# Patient Record
Sex: Female | Born: 1980 | Race: Black or African American | Hispanic: No | Marital: Single | Smoking: Former smoker
Health system: Southern US, Community
[De-identification: ages and names within clinical notes are randomized; demographics above are authoritative.]

## PROBLEM LIST (undated history)

## (undated) DIAGNOSIS — O1490 Unspecified pre-eclampsia, unspecified trimester: Secondary | ICD-10-CM

## (undated) DIAGNOSIS — Z8759 Personal history of other complications of pregnancy, childbirth and the puerperium: Secondary | ICD-10-CM

## (undated) DIAGNOSIS — O139 Gestational [pregnancy-induced] hypertension without significant proteinuria, unspecified trimester: Secondary | ICD-10-CM

## (undated) DIAGNOSIS — J45909 Unspecified asthma, uncomplicated: Secondary | ICD-10-CM

## (undated) DIAGNOSIS — E669 Obesity, unspecified: Secondary | ICD-10-CM

## (undated) DIAGNOSIS — I10 Essential (primary) hypertension: Secondary | ICD-10-CM

## (undated) DIAGNOSIS — J40 Bronchitis, not specified as acute or chronic: Secondary | ICD-10-CM

## (undated) DIAGNOSIS — N611 Abscess of the breast and nipple: Secondary | ICD-10-CM

## (undated) HISTORY — DX: Gestational (pregnancy-induced) hypertension without significant proteinuria, unspecified trimester: O13.9

## (undated) HISTORY — PX: LAPAROSCOPY FOR ECTOPIC PREGNANCY: SUR765

## (undated) HISTORY — DX: Unspecified asthma, uncomplicated: J45.909

## (undated) HISTORY — DX: Unspecified pre-eclampsia, unspecified trimester: O14.90

## (undated) HISTORY — DX: Abscess of the breast and nipple: N61.1

## (undated) HISTORY — DX: Personal history of other complications of pregnancy, childbirth and the puerperium: Z87.59

---
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
## 2001-02-20 ENCOUNTER — Emergency Department (HOSPITAL_COMMUNITY): Admission: EM | Admit: 2001-02-20 | Discharge: 2001-02-20 | Payer: Self-pay | Admitting: Emergency Medicine

## 2001-02-20 ENCOUNTER — Encounter: Payer: Self-pay | Admitting: Emergency Medicine

## 2001-10-01 ENCOUNTER — Emergency Department (HOSPITAL_COMMUNITY): Admission: EM | Admit: 2001-10-01 | Discharge: 2001-10-01 | Payer: Self-pay | Admitting: Emergency Medicine

## 2001-10-14 ENCOUNTER — Emergency Department (HOSPITAL_COMMUNITY): Admission: EM | Admit: 2001-10-14 | Discharge: 2001-10-14 | Payer: Self-pay | Admitting: Emergency Medicine

## 2003-05-07 ENCOUNTER — Emergency Department (HOSPITAL_COMMUNITY): Admission: EM | Admit: 2003-05-07 | Discharge: 2003-05-07 | Payer: Self-pay | Admitting: Emergency Medicine

## 2003-06-26 ENCOUNTER — Other Ambulatory Visit: Admission: RE | Admit: 2003-06-26 | Discharge: 2003-06-26 | Payer: Self-pay | Admitting: *Deleted

## 2003-06-26 ENCOUNTER — Other Ambulatory Visit: Admission: RE | Admit: 2003-06-26 | Discharge: 2003-06-26 | Payer: Self-pay | Admitting: Obstetrics and Gynecology

## 2003-07-10 ENCOUNTER — Ambulatory Visit (HOSPITAL_COMMUNITY): Admission: RE | Admit: 2003-07-10 | Discharge: 2003-07-10 | Payer: Self-pay | Admitting: Obstetrics and Gynecology

## 2003-08-07 ENCOUNTER — Ambulatory Visit (HOSPITAL_COMMUNITY): Admission: RE | Admit: 2003-08-07 | Discharge: 2003-08-07 | Payer: Self-pay | Admitting: Obstetrics and Gynecology

## 2003-10-09 ENCOUNTER — Ambulatory Visit (HOSPITAL_COMMUNITY): Admission: RE | Admit: 2003-10-09 | Discharge: 2003-10-09 | Payer: Self-pay | Admitting: Obstetrics and Gynecology

## 2003-10-09 ENCOUNTER — Inpatient Hospital Stay (HOSPITAL_COMMUNITY): Admission: AD | Admit: 2003-10-09 | Discharge: 2003-10-09 | Payer: Self-pay | Admitting: Obstetrics and Gynecology

## 2003-10-10 ENCOUNTER — Inpatient Hospital Stay (HOSPITAL_COMMUNITY): Admission: AD | Admit: 2003-10-10 | Discharge: 2003-10-10 | Payer: Self-pay | Admitting: Obstetrics and Gynecology

## 2003-10-11 ENCOUNTER — Inpatient Hospital Stay (HOSPITAL_COMMUNITY): Admission: AD | Admit: 2003-10-11 | Discharge: 2003-10-11 | Payer: Self-pay | Admitting: Obstetrics and Gynecology

## 2003-10-22 ENCOUNTER — Inpatient Hospital Stay (HOSPITAL_COMMUNITY): Admission: AD | Admit: 2003-10-22 | Discharge: 2003-10-22 | Payer: Self-pay | Admitting: Obstetrics and Gynecology

## 2003-10-22 ENCOUNTER — Ambulatory Visit (HOSPITAL_COMMUNITY): Admission: RE | Admit: 2003-10-22 | Discharge: 2003-10-22 | Payer: Self-pay | Admitting: Obstetrics and Gynecology

## 2003-10-29 ENCOUNTER — Ambulatory Visit (HOSPITAL_COMMUNITY): Admission: RE | Admit: 2003-10-29 | Discharge: 2003-10-29 | Payer: Self-pay | Admitting: Obstetrics and Gynecology

## 2003-12-17 ENCOUNTER — Inpatient Hospital Stay (HOSPITAL_COMMUNITY): Admission: AD | Admit: 2003-12-17 | Discharge: 2003-12-22 | Payer: Self-pay | Admitting: Obstetrics and Gynecology

## 2003-12-19 ENCOUNTER — Encounter (INDEPENDENT_AMBULATORY_CARE_PROVIDER_SITE_OTHER): Payer: Self-pay | Admitting: Specialist

## 2003-12-19 DIAGNOSIS — B951 Streptococcus, group B, as the cause of diseases classified elsewhere: Secondary | ICD-10-CM

## 2003-12-19 DIAGNOSIS — O1490 Unspecified pre-eclampsia, unspecified trimester: Secondary | ICD-10-CM

## 2003-12-23 ENCOUNTER — Inpatient Hospital Stay (HOSPITAL_COMMUNITY): Admission: AD | Admit: 2003-12-23 | Discharge: 2003-12-23 | Payer: Self-pay | Admitting: Obstetrics and Gynecology

## 2005-02-06 ENCOUNTER — Emergency Department (HOSPITAL_COMMUNITY): Admission: EM | Admit: 2005-02-06 | Discharge: 2005-02-06 | Payer: Self-pay | Admitting: Emergency Medicine

## 2005-05-18 ENCOUNTER — Encounter (INDEPENDENT_AMBULATORY_CARE_PROVIDER_SITE_OTHER): Payer: Self-pay | Admitting: *Deleted

## 2005-05-18 ENCOUNTER — Ambulatory Visit: Payer: Self-pay | Admitting: Obstetrics and Gynecology

## 2006-03-10 ENCOUNTER — Inpatient Hospital Stay (HOSPITAL_COMMUNITY): Admission: AD | Admit: 2006-03-10 | Discharge: 2006-03-10 | Payer: Self-pay | Admitting: Obstetrics and Gynecology

## 2006-06-30 ENCOUNTER — Emergency Department (HOSPITAL_COMMUNITY): Admission: EM | Admit: 2006-06-30 | Discharge: 2006-06-30 | Payer: Self-pay | Admitting: *Deleted

## 2006-11-09 ENCOUNTER — Encounter: Payer: Self-pay | Admitting: Emergency Medicine

## 2006-11-10 ENCOUNTER — Encounter: Payer: Self-pay | Admitting: Obstetrics & Gynecology

## 2006-11-10 ENCOUNTER — Ambulatory Visit (HOSPITAL_COMMUNITY): Admission: AD | Admit: 2006-11-10 | Discharge: 2006-11-10 | Payer: Self-pay | Admitting: Obstetrics & Gynecology

## 2008-07-11 ENCOUNTER — Emergency Department (HOSPITAL_COMMUNITY): Admission: EM | Admit: 2008-07-11 | Discharge: 2008-07-12 | Payer: Self-pay | Admitting: Emergency Medicine

## 2009-03-24 ENCOUNTER — Emergency Department (HOSPITAL_COMMUNITY): Admission: EM | Admit: 2009-03-24 | Discharge: 2009-03-24 | Payer: Self-pay | Admitting: Emergency Medicine

## 2010-02-06 ENCOUNTER — Encounter: Payer: Self-pay | Admitting: Obstetrics and Gynecology

## 2010-04-11 LAB — URINE MICROSCOPIC-ADD ON

## 2010-04-11 LAB — URINALYSIS, ROUTINE W REFLEX MICROSCOPIC
Bilirubin Urine: NEGATIVE
Glucose, UA: NEGATIVE mg/dL
Ketones, ur: NEGATIVE mg/dL
Leukocytes, UA: NEGATIVE
Nitrite: NEGATIVE
Protein, ur: NEGATIVE mg/dL
Specific Gravity, Urine: 1.023 (ref 1.005–1.030)
Urobilinogen, UA: 1 mg/dL (ref 0.0–1.0)
pH: 7.5 (ref 5.0–8.0)

## 2010-04-11 LAB — POCT PREGNANCY, URINE: Preg Test, Ur: NEGATIVE

## 2010-06-01 NOTE — Op Note (Signed)
Veronica Adams, Veronica Adams             ACCOUNT NO.:  [id number]   MEDICAL RECORD NO.:  [id number]          PATIENT TYPE:  OIB   LOCATION:  9372                          [facility]   PHYSICIAN:  Roseanna Rainbow, M.D.DATE OF BIRTH:  06-Feb-1980   DATE OF PROCEDURE:  11/10/2006  DATE OF DISCHARGE:  11/10/2006                               OPERATIVE REPORT   PREOPERATIVE DIAGNOSIS:  Ruptured tubal ectopic pregnancy.   POSTOPERATIVE DIAGNOSIS:  Ruptured tubal ectopic pregnancy, left sided.   PROCEDURE:  Operative laparoscopy, left salpingectomy.   SURGEON:  Cyd Silence.   ANESTHESIA:  General endotracheal.   ESTIMATED BLOOD LOSS:  Minimal.   COMPLICATIONS:  None.   IV fluid and urine output as per anesthesiology.   FINDINGS:  Upon survey of the abdominal pelvic viscera, there were  omental adhesions related to her previous cesarean delivery in the  midline to the parietal peritoneum of the anterior abdominal wall.  There was a ruptured, left-sided, tubal knee pregnancy in the isthmic  portion of the tube. There was a fairly large hemoperitoneum.   PROCEDURES:  The patient was brought to the OR with an IV running and a  Foley catheter in place.  She was given general anesthesia and placed in  the dorsal lithotomy position.  She was then prepped and draped in the  usual sterile fashion.  After a time-out had been completed, a bivalve  speculum was placed in the patient's vagina.  The anterior lip of the  cervix was grasped with a single-tooth tenaculum.  A Hulka manipulator  was then advanced into the uterus and secured to the anterior lip of  cervix.  The single-tooth tenaculum and speculum were then removed.  A a  midline incision was made above the umbilicus and carried down sharply  to the fascia.  The fascia was incised.  The posterior rectus sheath was  elevated and entered sharply.  The parietal peritoneum was entered  sharply as well.  Sutures of 0 Vicryl  were then placed in the fascia.  The Hassan trocar and sleeve were then advanced into the abdomen.  The  abdomen was then insufflated with CO2 gas.  Bilateral 5-mm ports and a  suprapubic 10-mm port were then advanced into the abdomen under direct  visualization.  The fallopian tube distal to the ectopic was then  grasped, and the endosalpinx was then cauterized and transected using  the gyrus.  The tube was transected proximal to the ectopic pregnancy,  again using the gyrus and Endoshears.  Approximately 500 mL of blood was  noted upon entry into the abdomen.  Attempts were made to irrigate and  evacuate as much clot and blood as possible.  The specimen was then  placed into an EndoCatch and removed from the abdomen.  The mesosalpinx  was inspected and felt to be hemostatic.  All the instruments were then  removed from the abdomen.  The fascial incisions for the supraumbilical  incision and the suprapubic incisions were repaired with 0 Vicryl  sutures.  The skin was reapproximated with interrupted subcuticular  sutures of 3-0 Vicryl and  Dermabond.  The Hulka manipulator was  removed from the cervix and uterus with minimal bleeding noted from the  vagina.  At the close of the procedure, the instrument and pack counts  were said to be correct x2.  The patient was awakened from general  anesthesia and taken to the PACU in stable condition.      Roseanna Rainbow, M.D.  Electronically Signed     LAJ/MEDQ  D:  11/10/2006  T:  11/11/2006  Job:  [id number]

## 2010-06-04 NOTE — Group Therapy Note (Signed)
NAME:  Veronica Adams, Veronica Adams NO.:  [id number]   MEDICAL RECORD NO.:  [id number]          PATIENT TYPE:  WOC   LOCATION:  [facility]                   [facility]AC[facility]   PHYSICIAN:  Argentina Donovan, MD        DATE OF BIRTH:  1980/08/14   DATE OF SERVICE:                                    CLINIC NOTE   HISTORY OF PRESENT ILLNESS:  The patient is a 30 year old black female  gravida 1 para 0-1-0-0-1 by cesarean section with a child 80 months old who  was put on Labetalol postpartum after severe episodes of preeclampsia with  hypertension.  After four months she stopped that, but lately has continued  to have frontal headaches when she gets excited that she thinks may be  related to labile blood pressure.  On the day she is here, blood pressure  144/87, and she weighs 275 pounds.  She is here for Pap smear which was done  and contraception advise.  We have advised her against the pill or the patch  because of her weight and also because of the hypertension and suggested  either the Nuvaring, which we are going to give her two samples of, or the  Mirena, which she is going to consider.  Her only complaint is occasional  achiness in the lower abdomen near the scar of her surgery.   PHYSICAL EXAMINATION:  ABDOMEN:  Soft, flat, nontender.  No masses or  organomegaly.  Scar is well-healed with no sign of any thickening or keloid  formation.  External genitalia is normal.  BUN is within normal limits.  Vagina is clean and with rugae.  Cervix is clean and nulliparous.  Pap smear  is taken.  Uterus and adnexa, because the patient's habitus, could not be  examined.   IMPRESSION:  She is desiring birth control, prescription for Nuvaring,  hypertension, and we are going to start her on hydrochlorothiazide 25 mg.  If her headaches continue, I want her to go to the Medical Clinic at [facility].  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     ______________________________  Argentina Donovan, MD     PR/MEDQ  D:  05/18/2005  T:  05/19/2005  Job:  [id number]

## 2010-06-04 NOTE — H&P (Signed)
NAME:  Veronica Adams, Veronica Adams             ACCOUNT NO.:  [id number]   MEDICAL RECORD NO.:  [id number]          PATIENT TYPE:  MAT   LOCATION:  MATC                          [facility]   PHYSICIAN:  Naima A. Dillard, M.D. DATE OF BIRTH:  [birth year]   DATE OF ADMISSION:  12/17/2003  DATE OF DISCHARGE:                                HISTORY & PHYSICAL   HISTORY OF PRESENT ILLNESS:  Ms. Mcconaughy is a 30 year old single black  female primigravida at [redacted] weeks gestation who presents from the office  secondary to ultrasound showing estimated fetal weight in the 7th to 8th  percentile, and AFI of 9.1.  Her pregnancy has been followed by the [facility] certified nurse midwife service, and has been remarkable for  (1) late to care, (2) smoker, (3) obesity, (4) group B strep positive, (5)  shortened cervix at [redacted] weeks gestation.   PRENATAL LABORATORIES:  Her prenatal labs were collected on June 26, 2003.  Hemoglobin was 12.8, hematocrit 38.1, platelets 242,000, blood type B  positive, antibody negative, sickle cell trait negative, RPR nonreactive,  rubella immune, hepatitis B surface antigen negative, HIV nonreactive.  Pap  smear within normal limits.  Gonorrhea negative, Chlamydia negative.  Cystic  fibrosis negative.  Quad screen was negative.  Cultures of the vaginal tract  for group B strep on October 09, 2003 was positive.  One-hour Glucola from  August 12, 2003 was 79.   HISTORY OF PRESENT PREGNANCY:  The patient presented for care at [facility] on June 26, 2003 at [redacted] weeks gestation.  She was treated for  bacterial vaginosis at that time.  Pregnancy ultrasonography on July 10, 2003 gave an [facility] of December 27, 2003.  Anatomy scan was completed at [redacted]  weeks gestation.  At that time, the cervix was 2.4 cm.  Repeat  ultrasonography at [redacted] weeks gestation showed 2.5 cm.  Pregnancy  ultrasonography at [redacted] weeks gestation showed estimated fetal weight in the  25th to 50th  percentile with AFI of 15.9 cm.  The cervix at that time was  1.5 cm transvaginally.  Fetal fibronectin was collected, as well as  gonorrhea, Chlamydia, and group B strep.  To digital exam, the cervix was  closed and 70%.  The patient was give betamethasone.  The patient was also  put on bed rest.  The gonorrhea and Chlamydia were negative.  Group B strep  was positive, and fetal fibronectin were all negative.  The next  ultrasonography at [redacted] weeks gestation showed a dynamic cervix, and at [redacted]  weeks gestation showed cervix of 1.3 cm with normal fluid.  The patient  continued on bed rest.  The rest of her prenatal care was unremarkable.   OBSTETRICAL HISTORY:  She is a primigravida.   MEDICAL HISTORY:  She has no medication allergies.  She experience menarche  at the age of 22 with 28 day cycles lasting 4-5 days.  She reports having  had the usual childhood illnesses.  She has chronic bronchitis and uses an  albuterol inhaler for that.  She has been a smoker  in the past, and she quit  at [redacted] weeks gestation.   FAMILY MEDICAL HISTORY:  Remarkable for a sister with depression.  Maternal  grandmother with diabetes.   GENETIC HISTORY:  Father of the baby's side has a cousin with mental  retardation.   SOCIAL HISTORY:  Father of the baby is not involved.  The patient has a high  school education, and is employed as a Water engineer.  She denies any  alcohol or illicit drug use with the pregnancy.   OBJECTIVE DATA:  VITAL SIGNS:  Blood pressures are 140s/80s.  Other vital  signs are stable.  She is afebrile.  HEENT:  Grossly within normal limits.  CHEST:  Clear to auscultation.  HEART:  Regular rate and rhythm.  ABDOMEN:  Gravid in contour with fundal height extending approximately 40 cm  above the pubic symphysis.  Electronic fetal monitoring shows reactive and  reassuring fetal heart rate with rare uterine contractions.  Cervix is  closed, 60%, vertex -3.   Ultrasound shows fluid at  4.5 cm, and estimated fetal weight determined in  the office of 7-8 percentile.  PIH labs are within normal limits.   ASSESSMENT:  1.  Intrauterine pregnancy at 38 weeks.  2.  Intrauterine growth retardation.  3.  Oligohydramnios.  4.  Unfavorable cervix.   PLAN:  1.  The plan is to admit to the birthing suites per Dr. Normand Sloop.  2.  Routine M.D. orders.  3.  Risks and benefits of induction have been reviewed with the patient.      She consents to proceeding with Cervidil ripening overnight followed by      Pitocin in the morning.  She understands the increased risks of cesarean      section and fetal distress with induction.  4.  Antibiotics will be started with in active labor for group B strep      prophylaxis.     Kimb   KS/MEDQ  D:  12/17/2003  T:  12/17/2003  Job:  [id number]

## 2010-06-04 NOTE — Discharge Summary (Signed)
Veronica Adams, Veronica Adams             ACCOUNT NO.:  [id number]   MEDICAL RECORD NO.:  [id number]          PATIENT TYPE:  INP   LOCATION:  9140                          [facility]   PHYSICIAN:  Hal Morales, M.D.DATE OF BIRTH:  Jun 05, 1980   DATE OF ADMISSION:  12/17/2003  DATE OF DISCHARGE:                                 DISCHARGE SUMMARY   ADMISSION DIAGNOSES:  1.  Intrauterine pregnancy at term.  2.  Intrauterine growth restriction.  3.  Oligohydramnios.  4.  Reassuring antenatal testing.  5.  Unfavorable cervix.   DISCHARGE DIAGNOSES:  1.  Intrauterine pregnancy at term.  2.  Intrauterine growth restriction.  3.  Oligohydramnios.  4.  Reassuring antenatal testing.  5.  Unfavorable cervix.  6.  Nonreassuring fetal heart rate tracing.  7.  Status post primary low transverse cesarean section.  8.  Bottle feeding.  9.  Plans oral contraceptives.  10. Fluid retention postpartum   PROCEDURES THIS ADMISSION:  1.  Primary low transverse cesarean section for delivery of a viable female      infant who had Apgars of 8 and 9 and weighed 5 pounds 1 ounce on      December 19, 2003; attended in delivery by Dr. Osborn Coho and Wynelle Bourgeois, C.N.M.   HOSPITAL COURSE:  Veronica Adams is a 30 year old gravida 1 para 0 at 76 and  four-sevenths weeks induced secondary to IUGR and oligohydramnios.  She had  an ultrasound today that showed an estimated fetal weight in the seventh to  eight percentile or less than 10th percentile, and an AFI of 4.5.  She was  admitted for cervical ripening initially with Cervidil and was then started  on Pitocin on December 18, 2003.  Her water was broken around 5:30 p.m. on  December 18, 2003 and at that point she was 3 cm, 90%, and a 0 station.  By  8:20 p.m. she was 5-6 cm and around 9:30 p.m. she had a recurrent  deceleration with contractions every 2 minutes and the Pitocin was then  discontinued.  She was fully dilated at 1:30 a.m. and had an  urge to push  but was having variable decelerations and a baseline heart rate of the 100s  to 110s.  She was thus recommended to proceed with a cesarean section for  delivery due to the change in fetal heart rate baseline and the variable  decelerations with pushing.  She underwent the cesarean section for delivery  of  a viable female infant who weighed 5 pounds 1 ounce and had Apgars of 8  and 9.  She did have a tight nuchal cord x2 with a body cord noted at  delivery.  She was attended in delivery by Dr. Osborn Coho and Wynelle Bourgeois, C.N.M.  Please see operative note for further details.  Postoperatively, the patient has done well.  She is ambulating, voiding, and  eating without difficulty.  She is bottle feeding her baby also without  difficulty.  She is afebrile and her vital signs have been stable throughout  her postoperative  course.  It was noted on the day of discharge that her  weight had increased almost 10 pounds from her admission weight.  She also  was noted to have had several blood pressures with diastolic readings of 90-  95.  [name] was given Lasix 40 mg po, and was then deemed ready for  discharge.   DISCHARGE INSTRUCTIONS:  As per the [facility] OB[facility] handout.   DISCHARGE MEDICATIONS:  1.  Motrin 600 mg p.o. q.6h. p.r.n. for pain.  2.  Tylox one to two p.o. q.4-6h. p.r.n. for pain.  3.  Prenatal vitamins daily.  4.  Lasix 40 mg bid  5.  KDur 10 meq daily   DISCHARGE LABORATORY DATA:  Her hemoglobin is 11.1; wbc count is 8.2; and  platelets are 170,000.   DISCHARGE FOLLOW-UP:  Home visit by [facility] Nurse in 48 hours for blood  pressure check  Visit at [facility] in 1-2 weeks for blood pressure and weight  check  In 6 weeks at [facility] or p.r.n.   DISCHARGE STATUS:  Well and stable.     Shel   SJD/MEDQ  D:  12/22/2003  T:  12/22/2003  Job:  [id number]

## 2010-06-04 NOTE — Op Note (Signed)
Veronica Adams, Veronica Adams             ACCOUNT NO.:  [id number]   MEDICAL RECORD NO.:  [id number]          PATIENT TYPE:  INP   LOCATION:  9176                          [facility]   PHYSICIAN:  Osborn Coho, M.D.   DATE OF BIRTH:  02/01/1980   DATE OF PROCEDURE:  12/19/2003  DATE OF DISCHARGE:                                 OPERATIVE REPORT   PREOPERATIVE DIAGNOSES:  1.  Term intrauterine pregnancy.  2.  Intrauterine growth retardation.  3.  Oligohydramnios.  4.  Induction.  5.  Nonreassuring fetal heart tracing.   POSTOPERATIVE DIAGNOSES:  1.  Term intrauterine pregnancy.  2.  Intrauterine growth retardation.  3.  Oligohydramnios.  4.  Induction.  5.  Nonreassuring fetal heart tracing.   PROCEDURE:  Primary low transverse cesarean section.   ANESTHESIA:  Epidural.   SURGEON:  Osborn Coho, M.D.   ASSISTANT:  Elby Showers. Williams, C.N.M.   FLUIDS:  1400 cc.   ESTIMATED BLOOD LOSS:  1000 cc.   URINE OUTPUT:  175 cc.   COMPLICATIONS:  None.   FINDINGS:  Live female infant with Apgars of 8 at one minute and 9 at five  minutes.  Nuchal cord x2 that was tight along with cord wrapped around body.  Weight 5 pounds 1 ounce.  Normal-appearing bilateral ovaries and fallopian  tubes.  Cord pH venous 7.22.  Unable to obtain arterial cord pH, but it was  attempted.   DESCRIPTION OF PROCEDURE:  The patient was taken to the operating room after  the risks, benefits, and alternatives were discussed with the patient.  The  patient verbalized understanding and consent verified.  The patient was  given a surgical level via the epidural and prepped and draped in the normal  sterile fashion.   A Pfannenstiel skin incision was made and carried down to the underlying  layer of fascia with the Bovie.  The fascia was excised bilaterally in the  midline with the Bovie and this fascial incision extended bilaterally with  the Mayo scissors.  Kocher clamps were placed on the inferior  aspect of the  fascial incision, and the rectus muscles were excised from the fascia.  The  same was done on the superior aspect of the fascial incision.  The muscles  were separated in the midline, and the peritoneum was entered bluntly.  The  bladder blade was placed, and the bladder flap was created with the  Metzenbaum scissors.  The uterine incision was made with the scalpel and  extended bilaterally with the bandage scissors.  The infant was delivered  and tight nuchal cord x2 with body cord, as stated in the findings.  The  cord was clamped and cut and the infant handed to the awaiting  pediatricians.  Cord blood was collected.  Cefoxitin was administered.  The  placenta was removed via fundal massage and sent to pathology.  The uterus  was cleared of all clots and debris.  The uterine incision was repaired with  0 Vicryl in a running locked fashion, and a second imbricating layer was  performed.  The intra-abdominal cavity was  irrigated, and the uterine  incision was noted to be hemostatic.  A 2-0 chromic was used to close the  peritoneum in a running fashion.  The fascia was repaired with 0 Vicryl in a  running fashion.  The subcutaneous tissue was irrigated and made hemostatic  with the Bovie.  A 2-0 plain was used to reapproximate the subcutaneous  tissue.  The skin was closed with 3-0 Monocryl using a subcuticular stitch.  Sponge, needle, and instrument counts were correct.   The patient tolerated the procedure well and was returned to the recovery  room in good condition.     Ange   AR/MEDQ  D:  12/19/2003  T:  12/19/2003  Job:  [id number]

## 2010-06-04 NOTE — Discharge Summary (Signed)
NAME:  Veronica Adams, Veronica Adams             ACCOUNT NO.:  [id number]   MEDICAL RECORD NO.:  [id number]          PATIENT TYPE:  INP   LOCATION:  9140                          [facility]   PHYSICIAN:  Naima A. Dillard, M.D. DATE OF BIRTH:  05-17-1980   DATE OF ADMISSION:  12/17/2003  DATE OF DISCHARGE:  12/22/2003                                 DISCHARGE SUMMARY   ADDENDUM TO DICTATION #161096:  The patient in her postpartum course was  noted to have some blood pressure elevations, diastolic's of 91 and 97 on  her postpartum vital signs and had an 8 pound weight gain since admission so  was started on Lasix 40 mg b.i.d. and Kay Ciel 20 mEq q.d. and is to have a  __________ nurse blood pressure check on December 24, 2003 with a followup at  [facility] in two weeks for a blood pressure check and weight  check. The patient is also to call for any signs or symptoms of shortness of  breath, chest pain, etc.     Shel   SJD/MEDQ  D:  12/22/2003  T:  12/23/2003  Job:  [id number]

## 2010-08-11 ENCOUNTER — Inpatient Hospital Stay (INDEPENDENT_AMBULATORY_CARE_PROVIDER_SITE_OTHER)
Admission: RE | Admit: 2010-08-11 | Discharge: 2010-08-11 | Disposition: A | Discharge status: Home / Self Care | Payer: Self-pay | Source: Ambulatory Visit | Attending: Emergency Medicine | Admitting: Emergency Medicine

## 2010-08-11 DIAGNOSIS — B86 Scabies: Secondary | ICD-10-CM

## 2010-10-27 LAB — COMPREHENSIVE METABOLIC PANEL
ALT: 12
AST: 16
Albumin: 3.2 — ABNORMAL LOW
Alkaline Phosphatase: 41
BUN: 12
Chloride: 105
Potassium: 3.5
Sodium: 136
Total Bilirubin: 0.4

## 2010-10-27 LAB — HCG, QUANTITATIVE, PREGNANCY: hCG, Beta Chain, Quant, S: 1892 — ABNORMAL HIGH

## 2010-10-27 LAB — BASIC METABOLIC PANEL
BUN: 7
CO2: 26
Calcium: 7.7 — ABNORMAL LOW
Chloride: 108
Creatinine, Ser: 0.84
GFR calc Af Amer: >60
GFR calc non Af Amer: >60
Glucose, Bld: 117 — ABNORMAL HIGH
Potassium: 3.7
Sodium: 137

## 2010-10-27 LAB — DIFFERENTIAL
Basophils Absolute: 0
Basophils Absolute: 0.1
Basophils Relative: 0
Basophils Relative: 1
Eosinophils Absolute: 0.1
Eosinophils Absolute: 0.3
Eosinophils Relative: 3
Monocytes Absolute: 0.3
Monocytes Relative: 4
Monocytes Relative: 5
Neutro Abs: 3.8
Neutro Abs: 8.5 — ABNORMAL HIGH
Neutrophils Relative %: 83 — ABNORMAL HIGH

## 2010-10-27 LAB — URINE MICROSCOPIC-ADD ON

## 2010-10-27 LAB — TYPE AND SCREEN
ABO/RH(D): B POS
ABO/RH(D): B POS
Antibody Screen: NEGATIVE
Antibody Screen: NEGATIVE

## 2010-10-27 LAB — PROTIME-INR
INR: 1
Prothrombin Time: 13.7

## 2010-10-27 LAB — CBC
HCT: 27.5 — ABNORMAL LOW
HCT: 33.2 — ABNORMAL LOW
HCT: 35 — ABNORMAL LOW
Hemoglobin: 11.4 — ABNORMAL LOW
Hemoglobin: 9.5 — ABNORMAL LOW
MCHC: 34.3
MCHC: 34.7
MCHC: 33
MCV: 91.7
MCV: 91.8
MCV: 92.4
Platelets: 172
Platelets: 197
Platelets: 183
Platelets: 83 — ABNORMAL LOW
RBC: 3 — ABNORMAL LOW
RBC: 3.61 — ABNORMAL LOW
RBC: 3.73 — ABNORMAL LOW
RDW: 13.6
RDW: 13.7
WBC: 10.1
WBC: 7.5
WBC: 10.2
WBC: 9

## 2010-10-27 LAB — URINALYSIS, ROUTINE W REFLEX MICROSCOPIC
Bilirubin Urine: NEGATIVE
Glucose, UA: NEGATIVE
Hgb urine dipstick: NEGATIVE
Ketones, ur: 15 — AB
Protein, ur: 30 — AB
Urobilinogen, UA: 1

## 2010-10-27 LAB — WET PREP, GENITAL
Trich, Wet Prep: NONE SEEN
Yeast Wet Prep HPF POC: NONE SEEN

## 2010-10-27 LAB — APTT: aPTT: 26

## 2012-05-20 ENCOUNTER — Emergency Department (HOSPITAL_COMMUNITY)
Admission: EM | Admit: 2012-05-20 | Discharge: 2012-05-20 | Disposition: A | Discharge status: Home / Self Care | Payer: Medicaid Other | Attending: Emergency Medicine | Admitting: Emergency Medicine

## 2012-05-20 ENCOUNTER — Encounter (HOSPITAL_COMMUNITY): Payer: Self-pay | Admitting: *Deleted

## 2012-05-20 DIAGNOSIS — L509 Urticaria, unspecified: Secondary | ICD-10-CM | POA: Insufficient documentation

## 2012-05-20 DIAGNOSIS — L254 Unspecified contact dermatitis due to food in contact with skin: Secondary | ICD-10-CM | POA: Insufficient documentation

## 2012-05-20 DIAGNOSIS — L299 Pruritus, unspecified: Secondary | ICD-10-CM | POA: Insufficient documentation

## 2012-05-20 MED ORDER — PREDNISONE 20 MG PO TABS
60.0000 mg | ORAL_TABLET | Freq: Once | ORAL | Status: AC
  Administered 2012-05-20: 60 mg via ORAL
  Filled 2012-05-20: qty 3

## 2012-05-20 MED ORDER — DIPHENHYDRAMINE HCL 25 MG PO CAPS
50.0000 mg | ORAL_CAPSULE | Freq: Once | ORAL | Status: AC
  Administered 2012-05-20: 50 mg via ORAL
  Filled 2012-05-20: qty 2

## 2012-05-20 MED ORDER — FAMOTIDINE 20 MG PO TABS
20.0000 mg | ORAL_TABLET | Freq: Once | ORAL | Status: AC
  Administered 2012-05-20: 20 mg via ORAL
  Filled 2012-05-20: qty 1

## 2012-05-20 MED ORDER — DIPHENHYDRAMINE HCL 50 MG PO CAPS: 25.0000 mg | ORAL_CAPSULE | ORAL | Status: DC | PRN

## 2012-05-20 MED ORDER — PREDNISONE 20 MG PO TABS: 60.0000 mg | ORAL_TABLET | Freq: Once | ORAL | Status: AC

## 2012-05-20 MED ORDER — FAMOTIDINE 20 MG PO TABS: 20.0000 mg | ORAL_TABLET | Freq: Two times a day (BID) | ORAL | Status: DC

## 2012-05-20 NOTE — ED Provider Notes (Signed)
History     CSN: [id number]  Arrival date & time 05/20/12  0958   First MD Initiated Contact with Patient 05/20/12 1003      No chief complaint on file.   (Consider location/radiation/quality/duration/timing/severity/associated sxs/prior treatment) Patient is a 32 y.o. female presenting with allergic reaction. The history is provided by the patient. No language interpreter was used.  Allergic Reaction The primary symptoms are  rash. The primary symptoms do not include wheezing, shortness of breath, nausea, vomiting, palpitations or angioedema. The current episode started 13 to 24 hours ago.  The onset of the reaction was associated with eating. Associated symptoms comments: Itching rash that started after going to a fish fry. Rash began on her face and spread to become generalized over the next few hours. No SOB, oral swelling. No history of anaphylaxis.Marland Kitchen    No past medical history on file.  No past surgical history on file.  No family history on file.  History  Substance Use Topics  . Smoking status: Not on file  . Smokeless tobacco: Not on file  . Alcohol Use: Not on file    OB History   No data available      Review of Systems  Constitutional: Negative for fever and chills.  HENT: Negative.  Negative for facial swelling.   Respiratory: Negative.  Negative for shortness of breath, wheezing and stridor.   Cardiovascular: Negative.  Negative for palpitations.  Gastrointestinal: Negative.  Negative for nausea and vomiting.  Musculoskeletal: Negative.   Skin: Positive for rash.  Neurological: Negative.  Negative for weakness.  Psychiatric/Behavioral: Negative for confusion.    Allergies  Review of patient's allergies indicates not on file.  Home Medications   Current Outpatient Rx  Name  Route  Sig  Dispense  Refill  . diphenhydrAMINE (BENADRYL) 25 MG tablet   Oral   Take 50 mg by mouth every 6 (six) hours as needed for itching or allergies.            There were no vitals taken for this visit.  Physical Exam  Constitutional: She is oriented to person, place, and time. She appears well-developed and well-nourished.  HENT:  Head: Normocephalic.  Mouth/Throat: Oropharynx is clear and moist.  Eyes: Conjunctivae are normal.  Neck: Normal range of motion. Neck supple.  Cardiovascular: Normal rate and regular rhythm.   No murmur heard. Pulmonary/Chest: Effort normal and breath sounds normal. She has no wheezes. She has no rales. She exhibits no tenderness.  Abdominal: Soft. Bowel sounds are normal. There is no tenderness. There is no rebound and no guarding.  Musculoskeletal: Normal range of motion.  Neurological: She is alert and oriented to person, place, and time.  Skin: Skin is warm and dry. No rash noted.  Welts c/w hives in generalized distribution.  Psychiatric: She has a normal mood and affect.    ED Course  Procedures (including critical care time)  Labs Reviewed - No data to display No results found.   No diagnosis found. 1. Hives    MDM  She feels better after medications in ED. Rash still present but decreasing. Patient feels stable and comfortable with discharge home.         Arnoldo Hooker, PA-C 05/20/12 1043

## 2012-05-20 NOTE — ED Provider Notes (Signed)
Medical screening examination/treatment/procedure(s) were performed by non-physician practitioner and as supervising physician I was immediately available for consultation/collaboration.   Charles B. Bernette Mayers, MD 05/20/12 1044

## 2012-05-20 NOTE — ED Notes (Signed)
To ED for eval of itching and rash after eating seafood last pm. States she has never had a reaction to seafood. No airway compromise. Skin w/d, resp e/u. Speaking in clear full sentences.

## 2013-07-18 ENCOUNTER — Encounter (HOSPITAL_COMMUNITY): Payer: Self-pay | Admitting: Emergency Medicine

## 2013-07-18 ENCOUNTER — Emergency Department (HOSPITAL_COMMUNITY)
Admission: EM | Admit: 2013-07-18 | Discharge: 2013-07-18 | Disposition: A | Discharge status: Home / Self Care | Payer: Medicaid Other | Attending: Emergency Medicine | Admitting: Emergency Medicine

## 2013-07-18 DIAGNOSIS — R197 Diarrhea, unspecified: Secondary | ICD-10-CM

## 2013-07-18 DIAGNOSIS — F17200 Nicotine dependence, unspecified, uncomplicated: Secondary | ICD-10-CM | POA: Insufficient documentation

## 2013-07-18 DIAGNOSIS — E669 Obesity, unspecified: Secondary | ICD-10-CM | POA: Insufficient documentation

## 2013-07-18 DIAGNOSIS — I10 Essential (primary) hypertension: Secondary | ICD-10-CM | POA: Insufficient documentation

## 2013-07-18 HISTORY — DX: Essential (primary) hypertension: I10

## 2013-07-18 HISTORY — DX: Bronchitis, not specified as acute or chronic: J40

## 2013-07-18 HISTORY — DX: Obesity, unspecified: E66.9

## 2013-07-18 LAB — CBC WITH DIFFERENTIAL/PLATELET
Basophils Absolute: 0 10*3/uL (ref 0.0–0.1)
Basophils Relative: 1 % (ref 0–1)
EOS ABS: 0.3 10*3/uL (ref 0.0–0.7)
EOS PCT: 3 % (ref 0–5)
HCT: 39.2 % (ref 36.0–46.0)
HEMOGLOBIN: 13.1 g/dL (ref 12.0–15.0)
LYMPHS ABS: 3.6 10*3/uL (ref 0.7–4.0)
LYMPHS PCT: 46 % (ref 12–46)
MCH: 30.3 pg (ref 26.0–34.0)
MCHC: 33.4 g/dL (ref 30.0–36.0)
MCV: 90.7 fL (ref 78.0–100.0)
MONOS PCT: 6 % (ref 3–12)
Monocytes Absolute: 0.5 10*3/uL (ref 0.1–1.0)
NEUTROS PCT: 44 % (ref 43–77)
Neutro Abs: 3.4 10*3/uL (ref 1.7–7.7)
PLATELETS: 236 10*3/uL (ref 150–400)
RBC: 4.32 MIL/uL (ref 3.87–5.11)
RDW: 13.2 % (ref 11.5–15.5)
WBC: 7.8 10*3/uL (ref 4.0–10.5)

## 2013-07-18 LAB — URINALYSIS, ROUTINE W REFLEX MICROSCOPIC
Bilirubin Urine: NEGATIVE
Glucose, UA: NEGATIVE mg/dL
HGB URINE DIPSTICK: NEGATIVE
KETONES UR: NEGATIVE mg/dL
Leukocytes, UA: NEGATIVE
NITRITE: NEGATIVE
Protein, ur: NEGATIVE mg/dL
Specific Gravity, Urine: 1.026 (ref 1.005–1.030)
UROBILINOGEN UA: 1 mg/dL (ref 0.0–1.0)
pH: 5.5 (ref 5.0–8.0)

## 2013-07-18 LAB — COMPREHENSIVE METABOLIC PANEL
ALT: 14 U/L (ref 0–35)
ANION GAP: 15 (ref 5–15)
AST: 18 U/L (ref 0–37)
Albumin: 3.8 g/dL (ref 3.5–5.2)
Alkaline Phosphatase: 49 U/L (ref 39–117)
BUN: 13 mg/dL (ref 6–23)
CO2: 23 meq/L (ref 19–32)
Calcium: 9.3 mg/dL (ref 8.4–10.5)
Chloride: 98 mEq/L (ref 96–112)
Creatinine, Ser: 1.06 mg/dL (ref 0.50–1.10)
GFR calc non Af Amer: 68 mL/min — ABNORMAL LOW (ref >90)
GFR, EST AFRICAN AMERICAN: 78 mL/min — AB (ref >90)
GLUCOSE: 94 mg/dL (ref 70–99)
POTASSIUM: 4.4 meq/L (ref 3.7–5.3)
Sodium: 136 mEq/L — ABNORMAL LOW (ref 137–147)
TOTAL PROTEIN: 7.6 g/dL (ref 6.0–8.3)
Total Bilirubin: 0.2 mg/dL — ABNORMAL LOW (ref 0.3–1.2)

## 2013-07-18 NOTE — ED Notes (Signed)
Pt. reports diarrhea onset 2 weeks ago " getting better" , intermittent abdominal cramping and malodorus urine .

## 2013-07-18 NOTE — ED Provider Notes (Signed)
CSN: [id number]     Arrival date & time 07/18/13  2017 History   First MD Initiated Contact with Patient 07/18/13 2301     Chief Complaint  Patient presents with  . Diarrhea     (Consider location/radiation/quality/duration/timing/severity/associated sxs/prior Treatment) HPI This is a 33 year old African American female patient who presents with complaints of frequent soft stools for the past 2 weeks. The patient says that every time she eats, she feels her stomach rumbling and then has a soft stool. She denies experiencing abdominal pain. She has not had any bloody stools or melena. No fever, nausea or vomiting.  The patient states that she feels like she may need to change her diet-specifically that she may need to try a lactose free diet. She has not her primary care physician. She is not taking any medications. She has no history of abdominal surgeries.  Past Medical History  Diagnosis Date  . Obesity   . Hypertension   . Bronchitis    Past Surgical History  Procedure Laterality Date  . Cesarean section     No family history on file. History  Substance Use Topics  . Smoking status: Current Every Day Smoker  . Smokeless tobacco: Not on file  . Alcohol Use: Yes   OB History   Grav Para Term Preterm Abortions TAB SAB Ect Mult Living                 Review of Systems Ten point review of symptoms performed and is negative with the exception of symptoms noted above.     Allergies  Review of patient's allergies indicates no known allergies.  Home Medications   Prior to Admission medications   Not on File   BP 126/83  Pulse 86  Temp(Src) 99.7 F (37.6 C) (Oral)  Resp 14  Ht 5\' 5"  (1.651 m)  Wt 287 lb (130.182 kg)  BMI 47.76 kg/m2  SpO2 99%  LMP 06/23/2013 Physical Exam Gen: well developed and well nourished appearing Head: NCAT Eyes: PERL, EOMI Nose: no epistaixis or rhinorrhea Mouth/throat: mucosa is moist and pink Neck: supple, no stridor Lungs: CTA B,  no wheezing, rhonchi or rales CV: RRR, no murmur, extremities appear well perfused.  Abd: soft, obese, notender, nondistended Back: no ttp, no cva ttp Skin: warm and dry Ext: normal to inspection, no dependent edema Neuro: CN ii-xii grossly intact, no focal deficits Psyche; normal affect,  calm and cooperative.   ED Course  Procedures (including critical care time) Labs Review   Results for orders placed during the hospital encounter of 07/18/13 (from the past 24 hour(s))  URINALYSIS, ROUTINE W REFLEX MICROSCOPIC     Status: None   Collection Time    07/18/13  8:06 PM      Result Value Ref Range   Color, Urine YELLOW  YELLOW   APPearance CLEAR  CLEAR   Specific Gravity, Urine 1.026  1.005 - 1.030   pH 5.5  5.0 - 8.0   Glucose, UA NEGATIVE  NEGATIVE mg/dL   Hgb urine dipstick NEGATIVE  NEGATIVE   Bilirubin Urine NEGATIVE  NEGATIVE   Ketones, ur NEGATIVE  NEGATIVE mg/dL   Protein, ur NEGATIVE  NEGATIVE mg/dL   Urobilinogen, UA 1.0  0.0 - 1.0 mg/dL   Nitrite NEGATIVE  NEGATIVE   Leukocytes, UA NEGATIVE  NEGATIVE  CBC WITH DIFFERENTIAL     Status: None   Collection Time    07/18/13  8:50 PM      Result Value  Ref Range   WBC 7.8  4.0 - 10.5 K/uL   RBC 4.32  3.87 - 5.11 MIL/uL   Hemoglobin 13.1  12.0 - 15.0 g/dL   HCT 40.939.2  81.136.0 - 91.446.0 %   MCV 90.7  78.0 - 100.0 fL   MCH 30.3  26.0 - 34.0 pg   MCHC 33.4  30.0 - 36.0 g/dL   RDW 78.213.2  95.611.5 - 21.315.5 %   Platelets 236  150 - 400 K/uL   Neutrophils Relative % 44  43 - 77 %   Neutro Abs 3.4  1.7 - 7.7 K/uL   Lymphocytes Relative 46  12 - 46 %   Lymphs Abs 3.6  0.7 - 4.0 K/uL   Monocytes Relative 6  3 - 12 %   Monocytes Absolute 0.5  0.1 - 1.0 K/uL   Eosinophils Relative 3  0 - 5 %   Eosinophils Absolute 0.3  0.0 - 0.7 K/uL   Basophils Relative 1  0 - 1 %   Basophils Absolute 0.0  0.0 - 0.1 K/uL  COMPREHENSIVE METABOLIC PANEL     Status: Abnormal   Collection Time    07/18/13  8:50 PM      Result Value Ref Range   Sodium  136 (*) 137 - 147 mEq/L   Potassium 4.4  3.7 - 5.3 mEq/L   Chloride 98  96 - 112 mEq/L   CO2 23  19 - 32 mEq/L   Glucose, Bld 94  70 - 99 mg/dL   BUN 13  6 - 23 mg/dL   Creatinine, Ser 0.861.06  0.50 - 1.10 mg/dL   Calcium 9.3  8.4 - 57.810.5 mg/dL   Total Protein 7.6  6.0 - 8.3 g/dL   Albumin 3.8  3.5 - 5.2 g/dL   AST 18  0 - 37 U/L   ALT 14  0 - 35 U/L   Alkaline Phosphatase 49  39 - 117 U/L   Total Bilirubin 0.2 (*) 0.3 - 1.2 mg/dL   GFR calc non Af Amer 68 (*) >90 mL/min   GFR calc Af Amer 78 (*) >90 mL/min   Anion gap 15  5 - 15     MDM   ED workup is reassuring with normal vital signs, benign abdominal exam, lack of abdominal pain and normal CBC, CMP and urinalysis.  I think the patient's suggestion that she try a a lactose-free diet is a good start. Ref[facility] to [facility] for [facility] followup we have discussed return cautioned to the    Brandt LoosenJulie Manly, MD 07/18/13 2328

## 2014-09-11 ENCOUNTER — Encounter (HOSPITAL_COMMUNITY): Payer: Self-pay | Admitting: *Deleted

## 2014-09-11 ENCOUNTER — Emergency Department (HOSPITAL_COMMUNITY)
Admission: EM | Admit: 2014-09-11 | Discharge: 2014-09-11 | Disposition: A | Discharge status: Home / Self Care | Payer: Self-pay | Attending: Emergency Medicine | Admitting: Emergency Medicine

## 2014-09-11 DIAGNOSIS — I10 Essential (primary) hypertension: Secondary | ICD-10-CM | POA: Insufficient documentation

## 2014-09-11 DIAGNOSIS — N939 Abnormal uterine and vaginal bleeding, unspecified: Secondary | ICD-10-CM

## 2014-09-11 DIAGNOSIS — A599 Trichomoniasis, unspecified: Secondary | ICD-10-CM

## 2014-09-11 DIAGNOSIS — A5901 Trichomonal vulvovaginitis: Secondary | ICD-10-CM | POA: Insufficient documentation

## 2014-09-11 DIAGNOSIS — Z720 Tobacco use: Secondary | ICD-10-CM | POA: Insufficient documentation

## 2014-09-11 DIAGNOSIS — Z3202 Encounter for pregnancy test, result negative: Secondary | ICD-10-CM | POA: Insufficient documentation

## 2014-09-11 DIAGNOSIS — E669 Obesity, unspecified: Secondary | ICD-10-CM | POA: Insufficient documentation

## 2014-09-11 LAB — WET PREP, GENITAL
Clue Cells Wet Prep HPF POC: NONE SEEN
Yeast Wet Prep HPF POC: NONE SEEN

## 2014-09-11 LAB — POC URINE PREG, ED: Preg Test, Ur: NEGATIVE

## 2014-09-11 MED ORDER — AZITHROMYCIN 250 MG PO TABS
1000.0000 mg | ORAL_TABLET | Freq: Once | ORAL | Status: AC
  Administered 2014-09-11: 1000 mg via ORAL
  Filled 2014-09-11: qty 4

## 2014-09-11 MED ORDER — LIDOCAINE HCL (PF) 1 % IJ SOLN
5.0000 mL | Freq: Once | INTRAMUSCULAR | Status: AC
  Administered 2014-09-11: 5 mL via INTRADERMAL
  Filled 2014-09-11 (×2): qty 5

## 2014-09-11 MED ORDER — LIDOCAINE HCL (PF) 1 % IJ SOLN: 2.0000 mL | Freq: Once | INTRAMUSCULAR | Status: DC

## 2014-09-11 MED ORDER — CEFTRIAXONE SODIUM 250 MG IJ SOLR
250.0000 mg | Freq: Once | INTRAMUSCULAR | Status: AC
  Administered 2014-09-11: 250 mg via INTRAMUSCULAR
  Filled 2014-09-11: qty 250

## 2014-09-11 MED ORDER — METRONIDAZOLE 500 MG PO TABS: 500.0000 mg | ORAL_TABLET | Freq: Two times a day (BID) | ORAL | Status: AC

## 2014-09-11 NOTE — ED Provider Notes (Signed)
CSN: [id number]     Arrival date & time 09/11/14  4098 History   First MD Initiated Contact with Patient 09/11/14 [phone]     Chief Complaint  Patient presents with  . Vaginal Bleeding     (Consider location/radiation/quality/duration/timing/severity/associated sxs/prior Treatment) Patient is a 34 y.o. female presenting with vaginal bleeding.  Vaginal Bleeding Quality:  Spotting and lighter than menses Severity:  Mild Onset quality:  Gradual Possible pregnancy: yes (sexually active, not protection last time)   Context: not genital trauma   Context comment:  Hx of regular cycles, LMP 8/11, then started spotting 3 days ago, hx of ectopic pregnancy Associated symptoms: no abdominal pain, no back pain (cramping mild), no dysuria, no fever, no nausea and no vaginal discharge   Risk factors: hx of ectopic pregnancy and unprotected sex   Risk factors: no bleeding disorder     Past Medical History  Diagnosis Date  . Obesity   . Hypertension   . Bronchitis    Past Surgical History  Procedure Laterality Date  . Cesarean section     History reviewed. No pertinent family history. Social History  Substance Use Topics  . Smoking status: Current Every Day Smoker -- 0.50 packs/day    Types: Cigarettes  . Smokeless tobacco: None  . Alcohol Use: Yes     Comment: socially    OB History    No data available     Review of Systems  Constitutional: Negative for fever.  HENT: Negative for sore throat.   Eyes: Negative for visual disturbance.  Respiratory: Negative for cough and shortness of breath.   Cardiovascular: Negative for chest pain.  Gastrointestinal: Negative for nausea and abdominal pain.  Genitourinary: Positive for vaginal bleeding. Negative for dysuria, vaginal discharge and difficulty urinating.  Musculoskeletal: Negative for back pain (cramping mild) and neck pain.  Skin: Negative for rash.  Neurological: Negative for syncope and headaches.      Allergies  Review of  patient's allergies indicates no known allergies.  Home Medications   Prior to Admission medications   Medication Sig Start Date End Date Taking? Authorizing Provider  metroNIDAZOLE (FLAGYL) 500 MG tablet Take 1 tablet (500 mg total) by mouth 2 (two) times daily. 09/11/14 09/18/14  Alvira Monday, MD   BP 145/90 mmHg  Pulse 60  Temp(Src) 98.6 F (37 C) (Oral)  Resp 16  Ht 5\' 5"  (1.651 m)  Wt 280 lb (127.007 kg)  BMI 46.59 kg/m2  SpO2 100%  LMP 08/24/2014 Physical Exam  Constitutional: She is oriented to person, place, and time. She appears well-developed and well-nourished. No distress.  HENT:  Head: Normocephalic and atraumatic.  Eyes: Conjunctivae and EOM are normal.  Neck: Normal range of motion.  Cardiovascular: Normal rate, regular rhythm, normal heart sounds and intact distal pulses.  Exam reveals no gallop and no friction rub.   No murmur heard. Pulmonary/Chest: Effort normal and breath sounds normal. No respiratory distress. She has no wheezes. She has no rales.  Abdominal: Soft. She exhibits no distension. There is no tenderness. There is no guarding.  Genitourinary: Cervix exhibits discharge. Cervix exhibits no motion tenderness. Right adnexum displays no mass and no tenderness. Left adnexum displays no mass and no tenderness.  Musculoskeletal: She exhibits no edema or tenderness.  Neurological: She is alert and oriented to person, place, and time.  Skin: Skin is warm and dry. No rash noted. She is not diaphoretic. No erythema.  Nursing note and vitals reviewed.   ED Course  Procedures (  including critical care time) Labs Review Labs Reviewed  WET PREP, GENITAL - Abnormal; Notable for the following:    Trich, Wet Prep MODERATE (*)    WBC, Wet Prep HPF POC TOO NUMEROUS TO COUNT (*)    All other components within normal limits  POC URINE PREG, ED  GC/CHLAMYDIA PROBE AMP ([place]) NOT AT [facility]    Imaging Review No results found. I have personally reviewed and  evaluated these images and lab results as part of my medical decision-making.   EKG Interpretation None      MDM   Final diagnoses:  Trichomonas infection  Vaginal spotting    34 year old female with a history of ectopic pregnancy presents with concern for vaginal bleeding.  Patient reports mild spotting and have low suspicion for subsequent anemia.  Differential diagnosis includes infection, abnormal uterine bleeding or ectopic pregnancy. No hx of trauma. Pregnancy test was negative.  Pelvic exam was performed which did not show signs of acute bleeding however did show vaginal discharge.  Wet prep was done which showed signs of Trichomonas. Patient was given Rocephin, azithromycin, and a prescription for metronidazole x7days. Discussed need for partner to be treated as well as use of protection. Patient was discharged in stable condition with understanding of reasons to return.    Alvira Monday, MD 09/11/14 1925

## 2014-09-11 NOTE — ED Notes (Signed)
Pt remains monitored by blood pressure and pulse ox.  

## 2014-09-11 NOTE — ED Notes (Signed)
Pt states she is noticed slight spotting the last two days with is abnormal for her.  Last mentral period was 8/7  And she states she doesn't normally spot in between periods.  Pt states she has minimal cramping.  Pt states Hx of tubal pregnancy and wants to be checked out.

## 2014-09-11 NOTE — ED Notes (Signed)
Called pharmacy and requested pt medication.

## 2014-09-11 NOTE — ED Notes (Signed)
Sent med request to pharmacy. 

## 2014-09-12 LAB — GC/CHLAMYDIA PROBE AMP (~~LOC~~) NOT AT ARMC
Chlamydia: NEGATIVE
Neisseria Gonorrhea: NEGATIVE

## 2015-01-09 ENCOUNTER — Encounter (HOSPITAL_COMMUNITY): Payer: Self-pay | Admitting: Neurology

## 2015-01-09 ENCOUNTER — Emergency Department (HOSPITAL_COMMUNITY)
Admission: EM | Admit: 2015-01-09 | Discharge: 2015-01-09 | Disposition: A | Discharge status: Home / Self Care | Payer: Medicaid Other | Attending: Emergency Medicine | Admitting: Emergency Medicine

## 2015-01-09 DIAGNOSIS — E669 Obesity, unspecified: Secondary | ICD-10-CM | POA: Insufficient documentation

## 2015-01-09 DIAGNOSIS — F17210 Nicotine dependence, cigarettes, uncomplicated: Secondary | ICD-10-CM | POA: Insufficient documentation

## 2015-01-09 DIAGNOSIS — I10 Essential (primary) hypertension: Secondary | ICD-10-CM | POA: Insufficient documentation

## 2015-01-09 DIAGNOSIS — Z8709 Personal history of other diseases of the respiratory system: Secondary | ICD-10-CM | POA: Insufficient documentation

## 2015-01-09 DIAGNOSIS — Z3202 Encounter for pregnancy test, result negative: Secondary | ICD-10-CM | POA: Insufficient documentation

## 2015-01-09 DIAGNOSIS — N644 Mastodynia: Secondary | ICD-10-CM | POA: Insufficient documentation

## 2015-01-09 LAB — PREGNANCY, URINE: PREG TEST UR: NEGATIVE

## 2015-01-09 MED ORDER — KETOROLAC TROMETHAMINE 60 MG/2ML IM SOLN
30.0000 mg | Freq: Once | INTRAMUSCULAR | Status: AC
  Administered 2015-01-09: 30 mg via INTRAMUSCULAR
  Filled 2015-01-09: qty 2

## 2015-01-09 MED ORDER — CEPHALEXIN 500 MG PO CAPS: 500.0000 mg | ORAL_CAPSULE | Freq: Four times a day (QID) | ORAL | Status: DC

## 2015-01-09 MED ORDER — TRAMADOL HCL 50 MG PO TABS: 50.0000 mg | ORAL_TABLET | Freq: Four times a day (QID) | ORAL | Status: DC | PRN

## 2015-01-09 NOTE — ED Provider Notes (Signed)
CSN: [id number]     Arrival date & time 01/09/15  1009 History   First MD Initiated Contact with Patient 01/09/15 1020     Chief Complaint  Patient presents with  . Breast Pain     (Consider location/radiation/quality/duration/timing/severity/associated sxs/prior Treatment) HPI  Blood pressure 134/97, pulse 94, temperature 98.4 F (36.9 C), temperature source Oral, resp. rate 17, height  (1.651 m), weight 120.657 kg, last menstrual period 12/14/2014, SpO2 100 %.  Veronica Adams is a 34 y.o. female with past medical history significant for obesity, hypertension complaining of left breast pain focally on the superior nipple, associated with swelling, worsening over the course of the last 3 days to 8 out of 10, no pain medication taken prior to arrival, no nipple discharge or masses. She has a positive family history of breast cancer in several of her aunts had it. She denies unintentional weight loss, easy bruising or bleeding, night sweats. Does not have OB/GYN. States that she normally gets breast pain for her. (Last period was end of November, however, that's normally bilateral)  Past Medical History  Diagnosis Date  . Obesity   . Hypertension   . Bronchitis    Past Surgical History  Procedure Laterality Date  . Cesarean section     No family history on file. Social History  Substance Use Topics  . Smoking status: Current Every Day Smoker -- 0.50 packs/day    Types: Cigarettes  . Smokeless tobacco: None  . Alcohol Use: Yes     Comment: socially    OB History    No data available     Review of Systems  10 systems reviewed and found to be negative, except as noted in the HPI.  Allergies  Review of patient's allergies indicates no known allergies.  Home Medications   Prior to Admission medications   Not on File   BP 134/97 mmHg  Pulse 94  Temp(Src) 98.4 F (36.9 C) (Oral)  Resp 17  Ht  (1.651 m)  Wt 120.657 kg  BMI 44.26 kg/m2  SpO2 100%  LMP  12/14/2014 Physical Exam  Constitutional: She is oriented to person, place, and time. She appears well-developed and well-nourished. No distress.  HENT:  Head: Normocephalic.  Eyes: Conjunctivae and EOM are normal.  Cardiovascular: Normal rate.   Pulmonary/Chest: Effort normal. No stridor.    Area of induration as diagrammed, no warmth, minimal tenderness to palpation, no nipple discharge, no peau d'orange, no axillary lymphadenopathy appreciated  Musculoskeletal: Normal range of motion.  Neurological: She is alert and oriented to person, place, and time.  Psychiatric: She has a normal mood and affect.  Nursing note and vitals reviewed.   ED Course  Procedures (including critical care time) Labs Review Labs Reviewed - No data to display  Imaging Review No results found. I have personally reviewed and evaluated these images and lab results as part of my medical decision-making.   EKG Interpretation None      MDM   Final diagnoses:  Pain of left breast    Filed Vitals:   01/09/15 1018 01/09/15 1030  BP: 134/97 129/91  Pulse: 94 84  Temp: 98.4 F (36.9 C)   TempSrc: Oral   Resp: 17   Height:  (1.651 m)   Weight: 120.657 kg   SpO2: 100% 100%    Medications  ketorolac (TORADOL) injection 30 mg (30 mg Intramuscular Given 01/09/15 1140)    Veronica Adams is 34 y.o. female presenting with  focal breast pain of her left nipple, there does appear to be induration on the skin, there is no lymphadenopathy  Or masses appreciated: Will start patient on treatment for possible early cellulitis. Patient given referral to OB/GYN, she does have a family history of breast cancer, and possible she will need to either be followed up or need mammogram the symptoms don't resolve with medical treatment.   Evaluation does not show pathology that would require ongoing emergent intervention or inpatient treatment. Pt is hemodynamically stable and mentating appropriately. Discussed  findings and plan with patient/guardian, who agrees with care plan. All questions answered. Return precautions discussed and outpatient follow up given.      Veronica Emeryicole Isabellah Sobocinski, PA-C 01/09/15 1916  Zadie Rhineonald Wickline, MD 01/09/15 2033

## 2015-01-09 NOTE — ED Notes (Signed)
Pt reports for several days left breast pain starts at the nipple and spreads outward.

## 2015-01-09 NOTE — Discharge Instructions (Signed)
For pain control you may take:  etaminophen 975mg  (this is 3 over the counter pills) four times a day. Do not drink alcohol or combine with other medications that have acetaminophen as an ingredient (Read the labels!).  For breakthrough pain you may take Tramadol. Do not drink alcohol drive or operate heavy machinery when taking Tramadol.  Please follow with your primary care doctor in the next 2 days for a check-up. They must obtain records for further management.   Do not hesitate to return to the Emergency Department for any new, worsening or concerning symptoms.

## 2015-01-09 NOTE — ED Notes (Signed)
C/o pain to left breast, from nipple spreading outward. Most of pain centered around nipple. No drainage noted to nipple. Area of tenderness is at the areola. No swelling of lymph nodes noted.

## 2015-01-15 ENCOUNTER — Ambulatory Visit (INDEPENDENT_AMBULATORY_CARE_PROVIDER_SITE_OTHER): Payer: Self-pay | Admitting: Family Medicine

## 2015-01-15 VITALS — BP 160/98 | HR 71 | Temp 98.4°F | Resp 18

## 2015-01-15 DIAGNOSIS — I10 Essential (primary) hypertension: Secondary | ICD-10-CM

## 2015-01-15 DIAGNOSIS — N611 Abscess of the breast and nipple: Secondary | ICD-10-CM | POA: Insufficient documentation

## 2015-01-15 DIAGNOSIS — O10919 Unspecified pre-existing hypertension complicating pregnancy, unspecified trimester: Secondary | ICD-10-CM | POA: Insufficient documentation

## 2015-01-15 MED ORDER — LISINOPRIL 20 MG PO TABS: 20.0000 mg | ORAL_TABLET | Freq: Every day | ORAL | Status: DC

## 2015-01-15 MED ORDER — SULFAMETHOXAZOLE-TRIMETHOPRIM 800-160 MG PO TABS: 1.0000 | ORAL_TABLET | Freq: Two times a day (BID) | ORAL | Status: DC

## 2015-01-15 NOTE — Patient Instructions (Signed)
Hypertension Hypertension, commonly called high blood pressure, is when the force of blood pumping through your arteries is too strong. Your arteries are the blood vessels that carry blood from your heart throughout your body. A blood pressure reading consists of a higher number over a lower number, such as 110/72. The higher number (systolic) is the pressure inside your arteries when your heart pumps. The lower number (diastolic) is the pressure inside your arteries when your heart relaxes. Ideally you want your blood pressure below 120/80. Hypertension forces your heart to work harder to pump blood. Your arteries may become narrow or stiff. Having untreated or uncontrolled hypertension can cause heart attack, stroke, kidney disease, and other problems. RISK FACTORS Some risk factors for high blood pressure are controllable. Others are not.  Risk factors you cannot control include:   Race. You may be at higher risk if you are African American.  Age. Risk increases with age.  Gender. Men are at higher risk than women before age 45 years. After age 65, women are at higher risk than men. Risk factors you can control include:  Not getting enough exercise or physical activity.  Being overweight.  Getting too much fat, sugar, calories, or salt in your diet.  Drinking too much alcohol. SIGNS AND SYMPTOMS Hypertension does not usually cause signs or symptoms. Extremely high blood pressure (hypertensive crisis) may cause headache, anxiety, shortness of breath, and nosebleed. DIAGNOSIS To check if you have hypertension, your health care provider will measure your blood pressure while you are seated, with your arm held at the level of your heart. It should be measured at least twice using the same arm. Certain conditions can cause a difference in blood pressure between your right and left arms. A blood pressure reading that is higher than normal on one occasion does not mean that you need treatment. If  it is not clear whether you have high blood pressure, you may be asked to return on a different day to have your blood pressure checked again. Or, you may be asked to monitor your blood pressure at home for 1 or more weeks. TREATMENT Treating high blood pressure includes making lifestyle changes and possibly taking medicine. Living a healthy lifestyle can help lower high blood pressure. You may need to change some of your habits. Lifestyle changes may include:  Following the DASH diet. This diet is high in fruits, vegetables, and whole grains. It is low in salt, red meat, and added sugars.  Keep your sodium intake below 2,300 mg per day.  Getting at least 30-45 minutes of aerobic exercise at least 4 times per week.  Losing weight if necessary.  Not smoking.  Limiting alcoholic beverages.  Learning ways to reduce stress. Your health care provider may prescribe medicine if lifestyle changes are not enough to get your blood pressure under control, and if one of the following is true:  You are 18-59 years of age and your systolic blood pressure is above 140.  You are 60 years of age or older, and your systolic blood pressure is above 150.  Your diastolic blood pressure is above 90.  You have diabetes, and your systolic blood pressure is over 140 or your diastolic blood pressure is over 90.  You have kidney disease and your blood pressure is above 140/90.  You have heart disease and your blood pressure is above 140/90. Your personal target blood pressure may vary depending on your medical conditions, your age, and other factors. HOME CARE INSTRUCTIONS    Have your blood pressure rechecked as directed by your health care provider.   Take medicines only as directed by your health care provider. Follow the directions carefully. Blood pressure medicines must be taken as prescribed. The medicine does not work as well when you skip doses. Skipping doses also puts you at risk for  problems.  Do not smoke.   Monitor your blood pressure at home as directed by your health care provider. SEEK MEDICAL CARE IF:   You think you are having a reaction to medicines taken.  You have recurrent headaches or feel dizzy.  You have swelling in your ankles.  You have trouble with your vision. SEEK IMMEDIATE MEDICAL CARE IF:  You develop a severe headache or confusion.  You have unusual weakness, numbness, or feel faint.  You have severe chest or abdominal pain.  You vomit repeatedly.  You have trouble breathing. MAKE SURE YOU:   Understand these instructions.  Will watch your condition.  Will get help right away if you are not doing well or get worse.   This information is not intended to replace advice given to you by your health care provider. Make sure you discuss any questions you have with your health care provider.   Document Released: 01/03/2005 Document Revised: 05/20/2014 Document Reviewed: 10/26/2012 Elsevier Interactive Patient Education 2016 [employer]  

## 2015-01-15 NOTE — Progress Notes (Signed)
   Subjective:    Patient ID: Veronica Adams, female    DOB: 29-Aug-1980, 34 y.o.   MRN: [id number]  HPI Patient referred here from ED for painful breast mass, which started about 1 week ago.  SHe was put on keflex, which has not improved her pain or the tender lump in her left areola.  No provoking or palliating factors.  Has history of hypertension.  Was on HCTZ at one point.  No other side effects.     Review of Systems  Constitutional: Negative for fever, chills and fatigue.  Gastrointestinal: Negative for nausea, vomiting and diarrhea.  Neurological: Negative for headaches.       Objective:   Physical Exam  Constitutional: She is oriented to person, place, and time. She appears well-developed and well-nourished.  HENT:  Head: Normocephalic and atraumatic.  Right Ear: External ear normal.  Left Ear: External ear normal.  Eyes: EOM are normal. Pupils are equal, round, and reactive to light. No scleral icterus.  Cardiovascular: Normal rate, regular rhythm and normal heart sounds.  Exam reveals no gallop and no friction rub.   No murmur heard. Pulmonary/Chest: Effort normal and breath sounds normal. No respiratory distress. She has no wheezes. She has no rales. She exhibits no tenderness.    Musculoskeletal: She exhibits no edema.  Neurological: She is alert and oriented to person, place, and time.  Skin: Skin is warm and dry.  Psychiatric: She has a normal mood and affect. Her behavior is normal. Judgment and thought content normal.   I have reviewed the patients past medical, family, and social history.  I have reviewed the patient's medication list and allergies.      Assessment & Plan:  1. Left breast abscess Change to bactrim.  Return if worsens prior to being seen at breast center - US BREAST LTD UNI LEFT INC AXILLA; Future - MM DIAG BREAST TOMO BILATERAL; Future  2. Essential hypertension Lisinopril 20mg  daily.  [facility]

## 2015-01-20 ENCOUNTER — Other Ambulatory Visit: Payer: Self-pay | Admitting: Family Medicine

## 2015-01-20 ENCOUNTER — Ambulatory Visit
Admission: RE | Admit: 2015-01-20 | Discharge: 2015-01-20 | Disposition: A | Discharge status: Home / Self Care | Payer: Medicaid Other | Source: Ambulatory Visit | Attending: Family Medicine | Admitting: Family Medicine

## 2015-01-20 DIAGNOSIS — N611 Abscess of the breast and nipple: Secondary | ICD-10-CM

## 2015-01-31 ENCOUNTER — Encounter (HOSPITAL_COMMUNITY): Payer: Self-pay | Admitting: Emergency Medicine

## 2015-01-31 ENCOUNTER — Emergency Department (INDEPENDENT_AMBULATORY_CARE_PROVIDER_SITE_OTHER)
Admission: EM | Admit: 2015-01-31 | Discharge: 2015-01-31 | Disposition: A | Discharge status: Home / Self Care | Payer: Self-pay | Source: Home / Self Care | Attending: Family Medicine | Admitting: Family Medicine

## 2015-01-31 DIAGNOSIS — J45901 Unspecified asthma with (acute) exacerbation: Secondary | ICD-10-CM

## 2015-01-31 DIAGNOSIS — J40 Bronchitis, not specified as acute or chronic: Secondary | ICD-10-CM

## 2015-01-31 MED ORDER — IPRATROPIUM-ALBUTEROL 0.5-2.5 (3) MG/3ML IN SOLN
RESPIRATORY_TRACT | Status: AC
  Filled 2015-01-31: qty 3

## 2015-01-31 MED ORDER — ALBUTEROL SULFATE HFA 108 (90 BASE) MCG/ACT IN AERS
1.0000 | INHALATION_SPRAY | Freq: Four times a day (QID) | RESPIRATORY_TRACT | Status: DC | PRN
Start: 2015-01-31 — End: 2016-02-01

## 2015-01-31 MED ORDER — IPRATROPIUM-ALBUTEROL 0.5-2.5 (3) MG/3ML IN SOLN
3.0000 mL | Freq: Once | RESPIRATORY_TRACT | Status: AC
  Administered 2015-01-31: 3 mL via RESPIRATORY_TRACT

## 2015-01-31 MED ORDER — AZITHROMYCIN 250 MG PO TABS: ORAL_TABLET | ORAL | Status: DC

## 2015-01-31 MED ORDER — PREDNISONE 20 MG PO TABS: 20.0000 mg | ORAL_TABLET | Freq: Every day | ORAL | Status: DC

## 2015-01-31 NOTE — ED Provider Notes (Signed)
CSN: [id number]     Arrival date & time 01/31/15  1413 History   First MD Initiated Contact with Patient 01/31/15 1632     Chief Complaint  Patient presents with  . Shortness of Breath  . Bronchitis   (Consider location/radiation/quality/duration/timing/severity/associated sxs/prior Treatment) Patient is a 35 y.o. female presenting with cough. The history is provided by the patient. No language interpreter was used.  Cough Cough characteristics:  Unable to specify Severity:  Moderate Onset quality:  Gradual Duration:  1 week Timing:  Constant Progression:  Worsening Chronicity:  New Smoker: yes   Context: smoke exposure and weather changes   Context: not animal exposure, not sick contacts and not with activity   Relieved by:  Nothing Worsened by:  Environmental changes Ineffective treatments:  Beta-agonist inhaler Associated symptoms: chills, shortness of breath, sinus congestion and wheezing   Associated symptoms: no chest pain, no ear fullness, no ear pain, no fever, no headaches and no sore throat   Associated symptoms comment:  ++ chest tigthness and wheezing Risk factors: no chemical exposure and no recent infection   Risk factors comment:  Hx asthma all her life   Past Medical History  Diagnosis Date  . Obesity   . Hypertension   . Bronchitis    Past Surgical History  Procedure Laterality Date  . Cesarean section     No family history on file. Social History  Substance Use Topics  . Smoking status: Current Every Day Smoker -- 0.50 packs/day    Types: Cigarettes  . Smokeless tobacco: None  . Alcohol Use: Yes     Comment: socially    OB History    No data available     Review of Systems  Constitutional: Positive for chills. Negative for fever.  HENT: Negative for ear pain and sore throat.   Respiratory: Positive for cough, shortness of breath and wheezing.   Cardiovascular: Negative.  Negative for chest pain.  Gastrointestinal: Negative.    Genitourinary: Negative.   Neurological: Negative for headaches.  All other systems reviewed and are negative.   Allergies  Review of patient's allergies indicates no known allergies.  Home Medications   Prior to Admission medications   Medication Sig Start Date End Date Taking? Authorizing Provider  acetaminophen (TYLENOL) 500 MG tablet Take 1,000 mg by mouth every 6 (six) hours as needed for mild pain.    Historical Provider, MD  lisinopril (PRINIVIL,ZESTRIL) 20 MG tablet Take 1 tablet (20 mg total) by mouth daily. 01/15/15   Levie HeritageJacob J Stinson, DO  sulfamethoxazole-trimethoprim (BACTRIM DS,SEPTRA DS) 800-160 MG tablet Take 1 tablet by mouth 2 (two) times daily. 01/15/15   Levie HeritageJacob J Stinson, DO  traMADol (ULTRAM) 50 MG tablet Take 1 tablet (50 mg total) by mouth every 6 (six) hours as needed. 01/09/15   Joni ReiningNicole Pisciotta, PA-C   Meds Ordered and Administered this Visit  Medications - No data to display  BP 144/91 mmHg  Pulse 76  Temp(Src) 98.8 F (37.1 C) (Oral)  Resp 20  SpO2 100%  LMP 12/14/2014 No data found.   Physical Exam  Constitutional: She appears well-developed. No distress.  Cardiovascular: Normal rate, regular rhythm and normal heart sounds.   No murmur heard. Pulmonary/Chest: Effort normal. No respiratory distress. She has wheezes in the right upper field, the right middle field, the right lower field, the left upper field, the left middle field and the left lower field. She has no rhonchi.  Abdominal: Soft. Bowel sounds are normal. She exhibits  no distension. There is no tenderness.  Musculoskeletal: Normal range of motion.  Nursing note and vitals reviewed.   ED Course  Procedures (including critical care time)  Labs Review Labs Reviewed - No data to display  Imaging Review No results found.   Visual Acuity Review  Right Eye Distance:   Left Eye Distance:   Bilateral Distance:    Right Eye Near:   Left Eye Near:    Bilateral Near:          MDM  No diagnosis found. Mild asthma exacerbation  Bronchitis  Mild exacerbation of her asthma likely from bronchitis/ viral URI.  Albuterol + Atrovent treatment given. She felt better after treatment and her wheezing improved some. Patient prescribed Zpak, Prednisone 20 mg qd for 10 days and I refilled her albuterol inhaler. Return precaution discussed.    Doreene Eland, MD 01/31/15 [id number]

## 2015-01-31 NOTE — ED Notes (Signed)
Pt brought back to triage room for nurse assessment C/o increased SOB, cough and nausea x 1 week States inhaler not working properly 1/2 pack everyday smoker Diminished lung field  No distress noted, pt back to waiting area Will cont to monitor

## 2015-01-31 NOTE — ED Notes (Signed)
Here with 1 week constant cough, yellow phlegm and increased SOB, and wheezing Pt has Hx Bronchitis, Asthma and smokes 1/2 cigarettes daily Denies chest pain, n,v or fever,chills Sats 100% RA

## 2015-01-31 NOTE — Discharge Instructions (Signed)
Asthma, Adult Asthma is a condition of the lungs in which the airways tighten and narrow. Asthma can make it hard to breathe. Asthma cannot be cured, but medicine and lifestyle changes can help control it. Asthma may be started (triggered) by:  Animal skin flakes (dander).  Dust.  Cockroaches.  Pollen.  Mold.  Smoke.  Cleaning products.  Hair sprays or aerosol sprays.  Paint fumes or strong smells.  Cold air, weather changes, and winds.  Crying or laughing hard.  Stress.  Certain medicines or drugs.  Foods, such as dried fruit, potato chips, and sparkling grape juice.  Infections or conditions (colds, flu).  Exercise.  Certain medical conditions or diseases.  Exercise or tiring activities. HOME CARE   Take medicine as told by your doctor.  Use a peak flow meter as told by your doctor. A peak flow meter is a tool that measures how well the lungs are working.  Record and keep track of the peak flow meter's readings.  Understand and use the asthma action plan. An asthma action plan is a written plan for taking care of your asthma and treating your attacks.  To help prevent asthma attacks:  Do not smoke. Stay away from secondhand smoke.  Change your heating and air conditioning filter often.  Limit your use of fireplaces and wood stoves.  Get rid of pests (such as roaches and mice) and their droppings.  Throw away plants if you see mold on them.  Clean your floors. Dust regularly. Use cleaning products that do not smell.  Have someone vacuum when you are not home. Use a vacuum cleaner with a HEPA filter if possible.  Replace carpet with wood, tile, or vinyl flooring. Carpet can trap animal skin flakes and dust.  Use allergy-proof pillows, mattress covers, and box spring covers.  Wash bed sheets and blankets every week in hot water and dry them in a dryer.  Use blankets that are made of polyester or cotton.  Clean bathrooms and kitchens with bleach.  If possible, have someone repaint the walls in these rooms with mold-resistant paint. Keep out of the rooms that are being cleaned and painted.  Wash hands often. GET HELP IF:  You have make a whistling sound when breaking (wheeze), have shortness of breath, or have a cough even if taking medicine to prevent attacks.  The colored mucus you cough up (sputum) is thicker than usual.  The colored mucus you cough up changes from clear or white to yellow, green, gray, or bloody.  You have problems from the medicine you are taking such as:  A rash.  Itching.  Swelling.  Trouble breathing.  You need reliever medicines more than 2-3 times a week.  Your peak flow measurement is still at 50-79% of your personal best after following the action plan for 1 hour.  You have a fever. GET HELP RIGHT AWAY IF:   You seem to be worse and are not responding to medicine during an asthma attack.  You are short of breath even at rest.  You get short of breath when doing very little activity.  You have trouble eating, drinking, or talking.  You have chest pain.  You have a fast heartbeat.  Your lips or fingernails start to turn blue.  You are light-headed, dizzy, or faint.  Your peak flow is less than 50% of your personal best.   This information is not intended to replace advice given to you by your health care provider. Make sure   you discuss any questions you have with your health care provider.   Document Released: 06/22/2007 Document Revised: 09/24/2014 Document Reviewed: 08/02/2012 Elsevier Interactive Patient Education 2016 [employer] Inc[employer]  

## 2015-02-04 ENCOUNTER — Ambulatory Visit
Admission: RE | Admit: 2015-02-04 | Discharge: 2015-02-04 | Disposition: A | Discharge status: Home / Self Care | Payer: Medicaid Other | Source: Ambulatory Visit | Attending: Family Medicine | Admitting: Family Medicine

## 2015-02-04 ENCOUNTER — Other Ambulatory Visit: Payer: Self-pay | Admitting: Family Medicine

## 2015-02-04 DIAGNOSIS — N611 Abscess of the breast and nipple: Secondary | ICD-10-CM

## 2015-02-25 ENCOUNTER — Other Ambulatory Visit: Payer: Medicaid Other

## 2015-09-16 ENCOUNTER — Ambulatory Visit: Payer: Medicaid Other | Admitting: Obstetrics & Gynecology

## 2015-12-23 ENCOUNTER — Inpatient Hospital Stay (HOSPITAL_COMMUNITY)
Admission: AD | Admit: 2015-12-23 | Discharge: 2015-12-24 | Disposition: A | Discharge status: Home / Self Care | Payer: Medicaid Other | Source: Ambulatory Visit | Attending: Obstetrics and Gynecology | Admitting: Obstetrics and Gynecology

## 2015-12-23 ENCOUNTER — Encounter (HOSPITAL_COMMUNITY): Payer: Self-pay

## 2015-12-23 DIAGNOSIS — F17210 Nicotine dependence, cigarettes, uncomplicated: Secondary | ICD-10-CM | POA: Insufficient documentation

## 2015-12-23 DIAGNOSIS — R102 Pelvic and perineal pain: Secondary | ICD-10-CM | POA: Insufficient documentation

## 2015-12-23 DIAGNOSIS — N898 Other specified noninflammatory disorders of vagina: Secondary | ICD-10-CM | POA: Insufficient documentation

## 2015-12-23 DIAGNOSIS — R300 Dysuria: Secondary | ICD-10-CM | POA: Insufficient documentation

## 2015-12-23 DIAGNOSIS — Z3202 Encounter for pregnancy test, result negative: Secondary | ICD-10-CM | POA: Insufficient documentation

## 2015-12-23 LAB — POCT PREGNANCY, URINE: Preg Test, Ur: NEGATIVE

## 2015-12-23 LAB — URINALYSIS, ROUTINE W REFLEX MICROSCOPIC
Bilirubin Urine: NEGATIVE
Glucose, UA: NEGATIVE mg/dL
Hgb urine dipstick: NEGATIVE
KETONES UR: NEGATIVE mg/dL
LEUKOCYTES UA: NEGATIVE
NITRITE: NEGATIVE
PH: 6 (ref 5.0–8.0)
PROTEIN: NEGATIVE mg/dL
Specific Gravity, Urine: 1.015 (ref 1.005–1.030)

## 2015-12-23 NOTE — MAU Provider Note (Signed)
History     CSN: [id number]  Arrival date and time: 12/23/15 2200   First Provider Initiated Contact with Patient 12/23/15 2345      Chief Complaint  Patient presents with  . Pelvic Pain   Pelvic Pain  The patient's primary symptoms include pelvic pain. This is a new problem. The current episode started in the past 7 days. The problem occurs intermittently. The problem has been unchanged. Pain severity now: 4/10  The problem affects both sides. She is not pregnant. Associated symptoms include abdominal pain, back pain, dysuria and urgency. Pertinent negatives include no chills, constipation, diarrhea, fever, frequency, nausea or vomiting. The vaginal discharge was clear. There has been no bleeding. The symptoms are aggravated by urinating. She has tried acetaminophen for the symptoms. The treatment provided mild relief. She uses nothing for contraception. Her menstrual history has been regular (LMP 11/28/15 ).     Past Medical History:  Diagnosis Date  . Bronchitis   . Hypertension   . Obesity     Past Surgical History:  Procedure Laterality Date  . CESAREAN SECTION    . LAPAROSCOPY FOR ECTOPIC PREGNANCY      No family history on file.  Social History  Substance Use Topics  . Smoking status: Current Every Day Smoker    Packs/day: 0.50    Types: Cigarettes  . Smokeless tobacco: Never Used  . Alcohol use Yes     Comment: socially     Allergies: No Known Allergies  Prescriptions Prior to Admission  Medication Sig Dispense Refill Last Dose  . acetaminophen (TYLENOL) 500 MG tablet Take 1,000 mg by mouth every 6 (six) hours as needed for mild pain.   Taking  . albuterol (PROVENTIL HFA;VENTOLIN HFA) 108 (90 Base) MCG/ACT inhaler Inhale 1-2 puffs into the lungs every 6 (six) hours as needed for wheezing or shortness of breath. 1 Inhaler 0   . azithromycin (ZITHROMAX) 250 MG tablet Take two tablet today. Then 1 tablet daily for 4 days 6 each 0   . lisinopril  (PRINIVIL,ZESTRIL) 20 MG tablet Take 1 tablet (20 mg total) by mouth daily. 90 tablet 0   . predniSONE (DELTASONE) 20 MG tablet Take 1 tablet (20 mg total) by mouth daily with breakfast. 10 tablet 0   . sulfamethoxazole-trimethoprim (BACTRIM DS,SEPTRA DS) 800-160 MG tablet Take 1 tablet by mouth 2 (two) times daily. 20 tablet 0   . traMADol (ULTRAM) 50 MG tablet Take 1 tablet (50 mg total) by mouth every 6 (six) hours as needed. 15 tablet 0 Taking    Review of Systems  Constitutional: Negative for chills and fever.  Gastrointestinal: Positive for abdominal pain. Negative for constipation, diarrhea, nausea and vomiting.  Genitourinary: Positive for dysuria, pelvic pain and urgency. Negative for frequency.  Musculoskeletal: Positive for back pain.   Physical Exam   Blood pressure (!) 155/108, pulse 81, temperature 97.9 F (36.6 C), temperature source Oral, resp. rate 20, SpO2 100 %.  Physical Exam  Nursing note and vitals reviewed. Constitutional: She is oriented to person, place, and time. She appears well-developed and well-nourished. No distress.  HENT:  Head: Normocephalic.  Cardiovascular: Normal rate.   Respiratory: Effort normal.  GI: Soft. There is no tenderness. There is no rebound.  Neurological: She is alert and oriented to person, place, and time.  Skin: Skin is warm and dry.  Psychiatric: She has a normal mood and affect.   Results for orders placed or performed during the hospital encounter of 12/23/15 (from  the past 24 hour(s))  Urinalysis, Routine w reflex microscopic     Status: None   Collection Time: 12/23/15 10:25 PM  Result Value Ref Range   Color, Urine YELLOW YELLOW   APPearance CLEAR CLEAR   Specific Gravity, Urine 1.015 1.005 - 1.030   pH 6.0 5.0 - 8.0   Glucose, UA NEGATIVE NEGATIVE mg/dL   Hgb urine dipstick NEGATIVE NEGATIVE   Bilirubin Urine NEGATIVE NEGATIVE   Ketones, ur NEGATIVE NEGATIVE mg/dL   Protein, ur NEGATIVE NEGATIVE mg/dL   Nitrite  NEGATIVE NEGATIVE   Leukocytes, UA NEGATIVE NEGATIVE  Pregnancy, urine POC     Status: None   Collection Time: 12/23/15 11:07 PM  Result Value Ref Range   Preg Test, Ur NEGATIVE NEGATIVE  Wet prep, genital     Status: Abnormal   Collection Time: 12/23/15 11:49 PM  Result Value Ref Range   Yeast Wet Prep HPF POC NONE SEEN NONE SEEN   Trich, Wet Prep NONE SEEN NONE SEEN   Clue Cells Wet Prep HPF POC NONE SEEN NONE SEEN   WBC, Wet Prep HPF POC FEW (A) NONE SEEN   Sperm NONE SEEN     MAU Course  Procedures  MDM   Assessment and Plan   1. Dysuria    DC home Comfort measures reviewed  UC, GC/CT pending  RX: none  Return to MAU as needed   Follow-up Information    [facility]   Contact information: [address] [place] [address] [phone]           Tawnya CrookHogan, Heather Donovan 12/23/2015, 11:46 PM

## 2015-12-23 NOTE — MAU Note (Signed)
Pt presents with complaint of dysuria, back pain, urgency.

## 2015-12-24 DIAGNOSIS — R300 Dysuria: Secondary | ICD-10-CM

## 2015-12-24 DIAGNOSIS — R102 Pelvic and perineal pain: Secondary | ICD-10-CM

## 2015-12-24 LAB — WET PREP, GENITAL
Clue Cells Wet Prep HPF POC: NONE SEEN
SPERM: NONE SEEN
TRICH WET PREP: NONE SEEN
Yeast Wet Prep HPF POC: NONE SEEN

## 2015-12-24 NOTE — Discharge Instructions (Signed)
Dysuria Introduction Dysuria is pain or discomfort while urinating. The pain or discomfort may be felt in the tube that carries urine out of the bladder (urethra) or in the surrounding tissue of the genitals. The pain may also be felt in the groin area, lower abdomen, and lower back. You may have to urinate frequently or have the sudden feeling that you have to urinate (urgency). Dysuria can affect both men and women, but is more common in women. Dysuria can be caused by many different things, including:  Urinary tract infection in women.  Infection of the kidney or bladder.  Kidney stones or bladder stones.  Certain sexually transmitted infections (STIs), such as chlamydia.  Dehydration.  Inflammation of the vagina.  Use of certain medicines.  Use of certain soaps or scented products that cause irritation. Follow these instructions at home: Watch your dysuria for any changes. The following actions may help to reduce any discomfort you are feeling:  Drink enough fluid to keep your urine clear or pale yellow.  Empty your bladder often. Avoid holding urine for long periods of time.  After a bowel movement or urination, women should cleanse from front to back, using each tissue only once.  Empty your bladder after sexual intercourse.  Take medicines only as directed by your health care provider.  If you were prescribed an antibiotic medicine, finish it all even if you start to feel better.  Avoid caffeine, tea, and alcohol. They can irritate the bladder and make dysuria worse. In men, alcohol may irritate the prostate.  Keep all follow-up visits as directed by your health care provider. This is important.  If you had any tests done to find the cause of dysuria, it is your responsibility to obtain your test results. Ask the lab or department performing the test when and how you will get your results. Talk with your health care provider if you have any questions about your  results. Contact a health care provider if:  You develop pain in your back or sides.  You have a fever.  You have nausea or vomiting.  You have blood in your urine.  You are not urinating as often as you usually do. Get help right away if:  You pain is severe and not relieved with medicines.  You are unable to hold down any fluids.  You or someone else notices a change in your mental function.  You have a rapid heartbeat at rest.  You have shaking or chills.  You feel extremely weak. This information is not intended to replace advice given to you by your health care provider. Make sure you discuss any questions you have with your health care provider. Document Released: 10/02/2003 Document Revised: 06/11/2015 Document Reviewed: 08/29/2013  2017 Elsevier  

## 2015-12-25 LAB — GC/CHLAMYDIA PROBE AMP (~~LOC~~) NOT AT ARMC
Chlamydia: NEGATIVE
NEISSERIA GONORRHEA: NEGATIVE

## 2016-01-31 ENCOUNTER — Emergency Department (HOSPITAL_COMMUNITY)
Admission: EM | Admit: 2016-01-31 | Discharge: 2016-01-31 | Disposition: A | Discharge status: Home / Self Care | Payer: Medicaid Other | Attending: Dermatology | Admitting: Dermatology

## 2016-01-31 DIAGNOSIS — R200 Anesthesia of skin: Secondary | ICD-10-CM | POA: Insufficient documentation

## 2016-01-31 DIAGNOSIS — I10 Essential (primary) hypertension: Secondary | ICD-10-CM | POA: Insufficient documentation

## 2016-01-31 DIAGNOSIS — Z5321 Procedure and treatment not carried out due to patient leaving prior to being seen by health care provider: Secondary | ICD-10-CM | POA: Insufficient documentation

## 2016-01-31 DIAGNOSIS — F17210 Nicotine dependence, cigarettes, uncomplicated: Secondary | ICD-10-CM | POA: Insufficient documentation

## 2016-01-31 DIAGNOSIS — R42 Dizziness and giddiness: Secondary | ICD-10-CM | POA: Insufficient documentation

## 2016-01-31 NOTE — ED Notes (Signed)
Called x3 in waiting, no answer

## 2016-02-01 ENCOUNTER — Encounter (HOSPITAL_COMMUNITY): Payer: Self-pay | Admitting: Emergency Medicine

## 2016-02-01 ENCOUNTER — Emergency Department (HOSPITAL_COMMUNITY)
Admission: EM | Admit: 2016-02-01 | Discharge: 2016-02-01 | Disposition: A | Discharge status: Home / Self Care | Payer: Medicaid Other | Attending: Emergency Medicine | Admitting: Emergency Medicine

## 2016-02-01 DIAGNOSIS — I10 Essential (primary) hypertension: Secondary | ICD-10-CM

## 2016-02-01 DIAGNOSIS — R42 Dizziness and giddiness: Secondary | ICD-10-CM

## 2016-02-01 DIAGNOSIS — F17210 Nicotine dependence, cigarettes, uncomplicated: Secondary | ICD-10-CM | POA: Insufficient documentation

## 2016-02-01 LAB — CBC
HCT: 42.1 % (ref 36.0–46.0)
Hemoglobin: 14.2 g/dL (ref 12.0–15.0)
MCH: 30.1 pg (ref 26.0–34.0)
MCHC: 33.7 g/dL (ref 30.0–36.0)
MCV: 89.2 fL (ref 78.0–100.0)
Platelets: 237 10*3/uL (ref 150–400)
RBC: 4.72 MIL/uL (ref 3.87–5.11)
RDW: 13.6 % (ref 11.5–15.5)
WBC: 5.9 10*3/uL (ref 4.0–10.5)

## 2016-02-01 LAB — BASIC METABOLIC PANEL
Anion gap: 6 (ref 5–15)
BUN: 10 mg/dL (ref 6–20)
CALCIUM: 9 mg/dL (ref 8.9–10.3)
CO2: 27 mmol/L (ref 22–32)
CREATININE: 0.87 mg/dL (ref 0.44–1.00)
Chloride: 104 mmol/L (ref 101–111)
GFR calc Af Amer: >60 mL/min (ref >60)
GLUCOSE: 102 mg/dL — AB (ref 65–99)
Potassium: 4 mmol/L (ref 3.5–5.1)
Sodium: 137 mmol/L (ref 135–145)

## 2016-02-01 LAB — URINALYSIS, ROUTINE W REFLEX MICROSCOPIC
BILIRUBIN URINE: NEGATIVE
Glucose, UA: NEGATIVE mg/dL
HGB URINE DIPSTICK: NEGATIVE
KETONES UR: NEGATIVE mg/dL
Leukocytes, UA: NEGATIVE
NITRITE: NEGATIVE
Protein, ur: NEGATIVE mg/dL
Specific Gravity, Urine: 1.01 (ref 1.005–1.030)
pH: 5 (ref 5.0–8.0)

## 2016-02-01 MED ORDER — MECLIZINE HCL 25 MG PO TABS: 25.0000 mg | ORAL_TABLET | Freq: Three times a day (TID) | ORAL | 0 refills | Status: DC | PRN

## 2016-02-01 MED ORDER — HYDROCHLOROTHIAZIDE 25 MG PO TABS: 25.0000 mg | ORAL_TABLET | Freq: Every day | ORAL | 1 refills | Status: DC

## 2016-02-01 MED ORDER — LISINOPRIL 20 MG PO TABS: 20.0000 mg | ORAL_TABLET | Freq: Every day | ORAL | 1 refills | Status: DC

## 2016-02-01 NOTE — Discharge Instructions (Signed)
Take the medications as prescribed. Follow up with a primary care doctor.

## 2016-02-01 NOTE — ED Notes (Signed)
Patient updated meds with pharmacy tech and realized she asked for the wrong prescription.  MD notified and will update discharge orders.

## 2016-02-01 NOTE — ED Triage Notes (Signed)
Pt complaint of flu like symptoms ongoing for 14 days with associated congestion, productive cough, and generalized body aches. Pt continues to report new onset dizziness and blurred vision yesterday 1200. Pt hx of hypertension; has not taken blood pressure medication "in a while."

## 2016-02-01 NOTE — ED Provider Notes (Addendum)
[facility]-EMERGENCY DEPT Provider Note   CSN: [id number] Arrival date & time: 02/01/16  0935     History   Chief Complaint Chief Complaint  Patient presents with  . Dizziness    HPI Veronica Parkinsicole L Adams is a [age] y.o. female.  HPI Pt had the flu a couple of weeks ago.  She was having cough and congestion but those symptoms improved.   She has been having some lightheadedness and dizziness.   Patient describes it as a room spinning sensation. It gets worse when she moves around. Yesterday the symptoms were severe. She tried taking some Dramamine at the recommendation of a pharmacist but has not helped much. Pt is complaining of headache and the back of her head, she is having tingling in her arm, she is also having tingling in both of her legs.  She is worried because she is not taking her blood pressure medication and does not want to have a stroke.  Patient states she takes hydrochlorothiazide Past Medical History:  Diagnosis Date  . Bronchitis   . Hypertension   . Obesity     Patient Active Problem List   Diagnosis Date Noted  . Essential hypertension 01/15/2015  . Left breast abscess 01/15/2015    Past Surgical History:  Procedure Laterality Date  . CESAREAN SECTION    . LAPAROSCOPY FOR ECTOPIC PREGNANCY      OB History    Gravida Para Term Preterm AB Living   3       2 1    SAB TAB Ectopic Multiple Live Births     1 1           Home Medications    Patient states she is taking hydrochlorothiazide and Dramamine  Family History No family history on file.  Social History Social History  Substance Use Topics  . Smoking status: Current Every Day Smoker    Packs/day: 0.50    Types: Cigarettes  . Smokeless tobacco: Never Used  . Alcohol use Yes     Comment: socially      Allergies   Patient has no known allergies.   Review of Systems Review of Systems  All other systems reviewed and are negative.    Physical Exam Updated Vital Signs BP 160/100 (BP  Location: Left Arm)   Pulse 88   Temp 98.9 F (37.2 C) (Oral)   Resp 16   LMP 12/30/2015   SpO2 98%   Physical Exam  Constitutional: She is oriented to person, place, and time. She appears well-developed and well-nourished. No distress.  Overweight, obese  HENT:  Head: Normocephalic and atraumatic.  Right Ear: External ear normal.  Left Ear: External ear normal.  Mouth/Throat: Oropharynx is clear and moist.  Normal tympanic membranes bilaterally  Eyes: Conjunctivae are normal. Right eye exhibits no discharge. Left eye exhibits no discharge. No scleral icterus.  Neck: Neck supple. No tracheal deviation present.  Cardiovascular: Normal rate, regular rhythm and intact distal pulses.   Pulmonary/Chest: Effort normal and breath sounds normal. No stridor. No respiratory distress. She has no wheezes. She has no rales.  Abdominal: Soft. Bowel sounds are normal. She exhibits no distension. There is no tenderness. There is no rebound and no guarding.  Musculoskeletal: She exhibits no edema or tenderness.  Neurological: She is alert and oriented to person, place, and time. She has normal strength. No cranial nerve deficit (no facial droop, extraocular movements intact, no slurred speech) or sensory deficit. She exhibits normal muscle tone. She displays no  seizure activity. Coordination normal.  No pronator drift bilateral upper extrem, able to hold both legs off bed for 5 seconds, sensation intact in all extremities, no visual field cuts, no left or right sided neglect, normal finger-nose exam bilaterally, no nystagmus noted   Skin: Skin is warm and dry. No rash noted.  Psychiatric: She has a normal mood and affect.  Nursing note and vitals reviewed.    ED Treatments / Results  Labs (all labs ordered are listed, but only abnormal results are displayed) Labs Reviewed  BASIC METABOLIC PANEL - Abnormal; Notable for the following:       Result Value   Glucose, Bld 102 (*)    All other  components within normal limits  URINALYSIS, ROUTINE W REFLEX MICROSCOPIC - Abnormal; Notable for the following:    APPearance HAZY (*)    All other components within normal limits  CBC    EKG  EKG Interpretation  Date/Time:  Monday February 01 2016 10:06:00 EST Ventricular Rate:  70 PR Interval:    QRS Duration: 91 QT Interval:  388 QTC Calculation: 419 R Axis:   47 Text Interpretation:  Sinus rhythm Low voltage, precordial leads Borderline T abnormalities, anterior leads Borderline ST elevation, lateral leads Baseline wander in lead(s) III aVF No significant change since last tracing Confirmed by Lou Irigoyen  MD-J, Brittni Hult ([id number]) on 02/01/2016 10:08:32 AM Also confirmed by Rica Heather  MD-J, Saira Kramme [phone]), editor Stout CT, Jola Babinski [phone])  on 02/01/2016 10:12:30 AM        Procedures Procedures (including critical care time)   Initial Impression / Assessment and Plan / ED Course  I have reviewed the triage vital signs and the nursing notes.  Pertinent labs & imaging results that were available during my care of the patient were reviewed by me and considered in my medical decision making (see chart for details).  Clinical Course   Patient's dizziness symptoms are suggestive of peripheral vertigo. I suspect this is related to her recent respiratory infection. She has no focal neurologic symptoms. I doubt stroke or TIA.  Patient also has hypertension. I think this is unrelated to her symptoms but she has not been taking her medications and her blood pressure is elevated today. I will give her a refill of her hydrochlorothiazide.  Final Clinical Impressions(s) / ED Diagnoses   Final diagnoses:  Hypertension, unspecified type  Vertigo    New Prescriptions New Prescriptions   HYDROCHLOROTHIAZIDE (HYDRODIURIL) 25 MG TABLET    Take 1 tablet (25 mg total) by mouth daily.     Linwood Dibbles, MD 02/01/16 1226  Pt changed her mind and states she takes lisinopril not HCTZ .  Changed to lisinopril  rx    Linwood Dibbles, MD 02/01/16 1239

## 2016-11-21 ENCOUNTER — Encounter (HOSPITAL_COMMUNITY): Payer: Self-pay | Admitting: *Deleted

## 2016-11-21 ENCOUNTER — Inpatient Hospital Stay (HOSPITAL_COMMUNITY)
Admission: AD | Admit: 2016-11-21 | Discharge: 2016-11-22 | Disposition: A | Discharge status: Home / Self Care | Payer: Self-pay | Source: Ambulatory Visit | Attending: Obstetrics and Gynecology | Admitting: Obstetrics and Gynecology

## 2016-11-21 DIAGNOSIS — Z5321 Procedure and treatment not carried out due to patient leaving prior to being seen by health care provider: Secondary | ICD-10-CM | POA: Insufficient documentation

## 2016-11-21 LAB — URINALYSIS, ROUTINE W REFLEX MICROSCOPIC
BILIRUBIN URINE: NEGATIVE
Glucose, UA: NEGATIVE mg/dL
HGB URINE DIPSTICK: NEGATIVE
Ketones, ur: NEGATIVE mg/dL
Leukocytes, UA: NEGATIVE
Nitrite: NEGATIVE
PH: 6 (ref 5.0–8.0)
Protein, ur: NEGATIVE mg/dL
SPECIFIC GRAVITY, URINE: 1.029 (ref 1.005–1.030)

## 2016-11-21 LAB — POCT PREGNANCY, URINE: PREG TEST UR: NEGATIVE

## 2016-11-21 NOTE — MAU Note (Signed)
Pt not in lobby.  

## 2016-11-21 NOTE — MAU Note (Signed)
PT  SAYS SHE HAD  SEX 3 WEEKS AGO WITHOUT   CONDOMS  - NOW HAS VAG AND PERINEUM-  IRRITATION , BLEEDING  AND  CRAMPS.   NO BIRTH CONTROL.    GYN-  CLINIC.   NO HPT.

## 2016-11-22 NOTE — MAU Note (Signed)
NOT IN LOBBY 

## 2016-12-14 ENCOUNTER — Ambulatory Visit: Payer: Self-pay | Admitting: Obstetrics & Gynecology

## 2017-01-06 ENCOUNTER — Emergency Department (HOSPITAL_COMMUNITY): Payer: Medicaid Other

## 2017-01-06 ENCOUNTER — Other Ambulatory Visit: Payer: Self-pay

## 2017-01-06 ENCOUNTER — Encounter (HOSPITAL_COMMUNITY): Payer: Self-pay | Admitting: Emergency Medicine

## 2017-01-06 ENCOUNTER — Emergency Department (HOSPITAL_COMMUNITY)
Admission: EM | Admit: 2017-01-06 | Discharge: 2017-01-06 | Disposition: A | Discharge status: Home / Self Care | Payer: Medicaid Other | Attending: Emergency Medicine | Admitting: Emergency Medicine

## 2017-01-06 DIAGNOSIS — R197 Diarrhea, unspecified: Secondary | ICD-10-CM | POA: Insufficient documentation

## 2017-01-06 DIAGNOSIS — F17210 Nicotine dependence, cigarettes, uncomplicated: Secondary | ICD-10-CM | POA: Insufficient documentation

## 2017-01-06 DIAGNOSIS — I10 Essential (primary) hypertension: Secondary | ICD-10-CM | POA: Insufficient documentation

## 2017-01-06 DIAGNOSIS — Z79899 Other long term (current) drug therapy: Secondary | ICD-10-CM | POA: Insufficient documentation

## 2017-01-06 DIAGNOSIS — R1084 Generalized abdominal pain: Secondary | ICD-10-CM | POA: Insufficient documentation

## 2017-01-06 LAB — CBC
HEMATOCRIT: 41.5 % (ref 36.0–46.0)
Hemoglobin: 13.7 g/dL (ref 12.0–15.0)
MCH: 30.6 pg (ref 26.0–34.0)
MCHC: 33 g/dL (ref 30.0–36.0)
MCV: 92.8 fL (ref 78.0–100.0)
Platelets: 247 10*3/uL (ref 150–400)
RBC: 4.47 MIL/uL (ref 3.87–5.11)
RDW: 14.1 % (ref 11.5–15.5)
WBC: 6.3 10*3/uL (ref 4.0–10.5)

## 2017-01-06 LAB — COMPREHENSIVE METABOLIC PANEL
ALBUMIN: 3.9 g/dL (ref 3.5–5.0)
ALK PHOS: 58 U/L (ref 38–126)
ALT: 16 U/L (ref 14–54)
AST: 21 U/L (ref 15–41)
Anion gap: 6 (ref 5–15)
BILIRUBIN TOTAL: 0.5 mg/dL (ref 0.3–1.2)
BUN: 8 mg/dL (ref 6–20)
CO2: 26 mmol/L (ref 22–32)
Calcium: 8.9 mg/dL (ref 8.9–10.3)
Chloride: 103 mmol/L (ref 101–111)
Creatinine, Ser: 0.91 mg/dL (ref 0.44–1.00)
GFR calc Af Amer: >60 mL/min (ref >60)
GFR calc non Af Amer: >60 mL/min (ref >60)
GLUCOSE: 99 mg/dL (ref 65–99)
POTASSIUM: 4 mmol/L (ref 3.5–5.1)
Sodium: 135 mmol/L (ref 135–145)
TOTAL PROTEIN: 7.3 g/dL (ref 6.5–8.1)

## 2017-01-06 LAB — URINALYSIS, ROUTINE W REFLEX MICROSCOPIC
BILIRUBIN URINE: NEGATIVE
GLUCOSE, UA: NEGATIVE mg/dL
HGB URINE DIPSTICK: NEGATIVE
KETONES UR: NEGATIVE mg/dL
Leukocytes, UA: NEGATIVE
NITRITE: NEGATIVE
PH: 5 (ref 5.0–8.0)
Protein, ur: NEGATIVE mg/dL
Specific Gravity, Urine: 1.009 (ref 1.005–1.030)

## 2017-01-06 LAB — I-STAT BETA HCG BLOOD, ED (MC, WL, AP ONLY): I-stat hCG, quantitative: <5 m[IU]/mL (ref <5)

## 2017-01-06 LAB — LIPASE, BLOOD: Lipase: 23 U/L (ref 11–51)

## 2017-01-06 MED ORDER — HYDROCHLOROTHIAZIDE 25 MG PO TABS: 25.0000 mg | ORAL_TABLET | Freq: Every day | ORAL | 1 refills | Status: DC

## 2017-01-06 MED ORDER — LISINOPRIL 20 MG PO TABS: 20.0000 mg | ORAL_TABLET | Freq: Every day | ORAL | 1 refills | Status: DC

## 2017-01-06 MED ORDER — HYDROCHLOROTHIAZIDE 25 MG PO TABS
25.0000 mg | ORAL_TABLET | Freq: Every day | ORAL | Status: DC
  Administered 2017-01-06: 25 mg via ORAL
  Filled 2017-01-06: qty 1

## 2017-01-06 MED ORDER — LISINOPRIL 20 MG PO TABS
20.0000 mg | ORAL_TABLET | Freq: Once | ORAL | Status: AC
  Administered 2017-01-06: 20 mg via ORAL
  Filled 2017-01-06: qty 1

## 2017-01-06 MED ORDER — SODIUM CHLORIDE 0.9 % IV BOLUS (SEPSIS)
1000.0000 mL | Freq: Once | INTRAVENOUS | Status: AC
  Administered 2017-01-06: 1000 mL via INTRAVENOUS

## 2017-01-06 MED ORDER — DICYCLOMINE HCL 20 MG PO TABS: 20.0000 mg | ORAL_TABLET | Freq: Two times a day (BID) | ORAL | 0 refills | Status: DC | PRN

## 2017-01-06 MED ORDER — DICYCLOMINE HCL 10 MG PO CAPS
10.0000 mg | ORAL_CAPSULE | Freq: Once | ORAL | Status: AC
  Administered 2017-01-06: 10 mg via ORAL
  Filled 2017-01-06: qty 1

## 2017-01-06 NOTE — ED Notes (Signed)
Patient transported to X-ray 

## 2017-01-06 NOTE — Discharge Instructions (Signed)
1. Medications: Bentyl, HCTZ, lisinopril, usual home medications 2. Treatment: rest, drink plenty of fluids, advance diet slowly 3. Follow Up: Please followup with your primary doctor in 2 days for discussion of your diagnoses and further evaluation after today's visit; if you do not have a primary care doctor use the resource guide provided to find one; Please return to the ER for persistent vomiting, high fevers or worsening symptoms

## 2017-01-06 NOTE — ED Notes (Signed)
Pt verbalizes understanding of d/c instructions. Pt received prescriptions. Pt ambulatory at d/c with all belongings.  

## 2017-01-06 NOTE — ED Provider Notes (Signed)
[facility] Provider Note   CSN: [id number] Arrival date & time: 01/06/17  1659     History   Chief Complaint Chief Complaint  Patient presents with  . Abdominal Pain    HPI Veronica Parkinsicole L Gouveia is a 36 y.o. female with a hx of bronchitis, HTN, obesity presents to the Emergency Department complaining of gradual, persistent, progressively worsening generalized pain onset 3 days ago.  Pt reports hx of "gi bug" 3 weeks ago that lasted 3 days and resolved. 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  Pt reports no nausea or vomiting, but she has had diarrhea.  She reports 4 episodes of loose stool yesterday and 2 today.  No melena or hematochezia.  Pt reports these symptoms are prompted by eating.  She has taken motrin which has helped with the pain. Pt denies fevers, chills, headache, neck pain, chest pain, SOB, weakness, dizziness, syncope, dysuria.  No international travel, no antibiotic usage in the last 6 mos.  Hx of ectopic pregnancy in 2008.  Patient denies dysuria, vaginal discharge, vaginal bleeding.  She denies risk for STD.  Pt reports she has been out of her BP medications x 2 mos due to a lack of insurance.  She denies chest pain, shortness of breath or peripheral edema.  She notes that her blood pressure is high and is concerned about this..       The history is provided by the patient and medical records. No language interpreter was used.    Past Medical History:  Diagnosis Date  . Bronchitis   . Hypertension   . Obesity     Patient Active Problem List   Diagnosis Date Noted  . Essential hypertension 01/15/2015  . Left breast abscess 01/15/2015    Past Surgical History:  Procedure Laterality Date  . CESAREAN SECTION    . LAPAROSCOPY FOR ECTOPIC PREGNANCY      OB History    Gravida Para Term Preterm AB Living   3       2 1    SAB TAB Ectopic Multiple Live  Births     1 1   1        Home Medications    Prior to Admission medications   Medication Sig Start Date End Date Taking? Authorizing Provider  acetaminophen (TYLENOL) 500 MG tablet Take 1,000 mg by mouth every 6 (six) hours as needed for mild pain.   Yes [provider]  dicyclomine (BENTYL) 20 MG tablet Take 1 tablet (20 mg total) by mouth 2 (two) times daily as needed (Abdominal cramping). 01/06/17   Xaviar Lunn, Dahlia ClientHannah, PA-C  hydrochlorothiazide (HYDRODIURIL) 25 MG tablet Take 1 tablet (25 mg total) by mouth daily. 01/06/17   Sadat Sliwa, Dahlia ClientHannah, PA-C  lisinopril (PRINIVIL,ZESTRIL) 20 MG tablet Take 1 tablet (20 mg total) by mouth daily. 01/06/17   Skeeter Sheard, Dahlia ClientHannah, PA-C    Family History No family history on file.  Social History Social History   Tobacco Use  . Smoking status: Current Every Day Smoker    Packs/day: 0.50    Types: Cigarettes  . Smokeless tobacco: Never Used  Substance Use Topics  . Alcohol use: Yes    Comment: socially   . Drug use: Yes    Types: Marijuana     Allergies   Patient has no known allergies.   Review of Systems Review of Systems  Constitutional: Negative for appetite change,  diaphoresis, fatigue, fever and unexpected weight change.  HENT: Negative for mouth sores.   Eyes: Negative for visual disturbance.  Respiratory: Negative for cough, chest tightness, shortness of breath and wheezing.   Cardiovascular: Negative for chest pain.  Gastrointestinal: Positive for abdominal pain and diarrhea. Negative for constipation, nausea and vomiting.  Endocrine: Negative for polydipsia, polyphagia and polyuria.  Genitourinary: Negative for dysuria, frequency, hematuria and urgency.  Musculoskeletal: Negative for back pain and neck stiffness.  Skin: Negative for rash.  Allergic/Immunologic: Negative for immunocompromised state.  Neurological: Negative for syncope, light-headedness and headaches.  Hematological: Does not bruise/bleed  easily.  Psychiatric/Behavioral: Negative for sleep disturbance. The patient is not nervous/anxious.      Physical Exam Updated Vital Signs BP (!) 167/102   Pulse 74   Temp 99 F (37.2 C) (Oral)   Resp 18   Ht 5\' 5"  (1.651 m)   Wt 127 kg (280 lb)   LMP 12/25/2016 (Exact Date)   SpO2 98%   BMI 46.59 kg/m   Physical Exam  Constitutional: She appears well-developed and well-nourished. No distress.  Awake, alert, nontoxic appearance  HENT:  Head: Normocephalic and atraumatic.  Mouth/Throat: Oropharynx is clear and moist. No oropharyngeal exudate.  Eyes: Conjunctivae are normal. No scleral icterus.  Neck: Normal range of motion. Neck supple.  Cardiovascular: Normal rate, regular rhythm and intact distal pulses.  Pulmonary/Chest: Effort normal and breath sounds normal. No respiratory distress. She has no wheezes.  Equal chest expansion  Abdominal: Soft. Bowel sounds are normal. She exhibits no mass. There is no tenderness. There is no rebound and no guarding.  Exam limited by body habitus  Musculoskeletal: Normal range of motion. She exhibits no edema.  Neurological: She is alert.  Speech is clear and goal oriented Moves extremities without ataxia  Skin: Skin is warm and dry. She is not diaphoretic.  Psychiatric: She has a normal mood and affect.  Nursing note and vitals reviewed.    ED Treatments / Results  Labs (all labs ordered are listed, but only abnormal results are displayed) Labs Reviewed  URINALYSIS, ROUTINE W REFLEX MICROSCOPIC - Abnormal; Notable for the following components:      Result Value   Color, Urine STRAW (*)    All other components within normal limits  LIPASE, BLOOD  COMPREHENSIVE METABOLIC PANEL  CBC  I-STAT BETA HCG BLOOD, ED ([facility], [facility], [facility] ONLY)    EKG  EKG Interpretation None       Radiology Dg Abd Acute W/chest  Result Date: 01/06/2017 CLINICAL DATA:  Abdominal pain EXAM: DG ABDOMEN ACUTE W/ 1V CHEST COMPARISON:  None. FINDINGS:  There is no evidence of dilated bowel loops or free intraperitoneal air. No radiopaque calculi or other significant radiographic abnormality is seen. Heart size and mediastinal contours are within normal limits. Both lungs are clear. IMPRESSION: Negative abdominal radiographs.  No acute cardiopulmonary disease. Electronically Signed   By: Jasmine Pang M.D.   On: 01/06/2017 22:31    Procedures Procedures (including critical care time)  Medications Ordered in ED Medications  hydrochlorothiazide (HYDRODIURIL) tablet 25 mg (25 mg Oral Given 01/06/17 2158)  lisinopril (PRINIVIL,ZESTRIL) tablet 20 mg (20 mg Oral Given 01/06/17 2158)  dicyclomine (BENTYL) capsule 10 mg (10 mg Oral Given 01/06/17 2203)  sodium chloride 0.9 % bolus 1,000 mL (1,000 mLs Intravenous New Bag/Given 01/06/17 2211)     Initial Impression / Assessment and Plan / ED Course  I have reviewed the triage vital signs and the nursing notes.  Pertinent labs & imaging results that were available during my care of the patient were reviewed by me and considered in my medical decision making (see chart for details).     Patient presents with central abdominal cramping and loose stools after exposure to patients with similar symptoms.  Abdomen is soft and nontender on my exam.  Patient is well-appearing.  She reports concern for dehydration however her urine is not concentrated and her mucous membranes are moist.  Labs are reassuring without leukocytosis.  Patient is afebrile.  She has been without vomiting or diarrhea.  Acute abdominal x-ray is without dilated bowel loops or evidence of bowel obstruction.  Highly doubt appendicitis, cholecystitis, pancreatitis, AAA, mesenteric ischemia.  Patient noted to be hypertensive in the emergency department.  No signs of hypertensive urgency.  Discussed with patient the need for close follow-up and management by their primary care physician.   10:41 PM Patient reports she is feeling some  better after the medications.  Her abdomen remains soft and nontender on exam.  It has improved.  Results reviewed with patient she will be given Bentyl for symptomatic therapy at home.  She is to follow-up with a [facility] for further evaluation and treatment of her hypertension.  Previous medications have been refilled.  She states understanding and is in agreement with this plan.  BP (!) 160/103   Pulse 65   Temp 99 F (37.2 C) (Oral)   Resp 18   Ht 5\' 5"  (1.651 m)   Wt 127 kg (280 lb)   LMP 12/25/2016 (Exact Date)   SpO2 100%   BMI 46.59 kg/m    Final Clinical Impressions(s) / ED Diagnoses   Final diagnoses:  Diarrhea of presumed infectious origin  Generalized abdominal pain  Essential hypertension    ED Discharge Orders        Ordered    hydrochlorothiazide (HYDRODIURIL) 25 MG tablet  Daily     01/06/17 2256    lisinopril (PRINIVIL,ZESTRIL) 20 MG tablet  Daily     01/06/17 2256    dicyclomine (BENTYL) 20 MG tablet  2 times daily PRN     01/06/17 2256       Elyse Prevo, Boyd KerbsHannah, PA-C 01/06/17 2259    Mancel BaleWentz, Elliott, MD 01/07/17 [phone]

## 2017-01-06 NOTE — ED Notes (Signed)
Pt is aware she needs a urine sample 

## 2017-01-06 NOTE — ED Triage Notes (Signed)
Pt c/o left abd x's 1 week.  Pt also c/o diarrhea.  Denies nausea or vomiting.  Pt st's she feels dehydrated.  Pt also has HTN but has not been taking her meds for same

## 2017-07-01 ENCOUNTER — Encounter (HOSPITAL_COMMUNITY): Payer: Self-pay | Admitting: *Deleted

## 2017-07-01 ENCOUNTER — Ambulatory Visit (HOSPITAL_COMMUNITY)
Admission: EM | Admit: 2017-07-01 | Discharge: 2017-07-01 | Disposition: A | Discharge status: Home / Self Care | Payer: Self-pay | Attending: Family Medicine | Admitting: Family Medicine

## 2017-07-01 ENCOUNTER — Other Ambulatory Visit: Payer: Self-pay

## 2017-07-01 DIAGNOSIS — R1084 Generalized abdominal pain: Secondary | ICD-10-CM

## 2017-07-01 MED ORDER — HYDROCHLOROTHIAZIDE 25 MG PO TABS: 25.0000 mg | ORAL_TABLET | Freq: Every day | ORAL | 1 refills | Status: DC

## 2017-07-01 MED ORDER — LISINOPRIL 20 MG PO TABS: 20.0000 mg | ORAL_TABLET | Freq: Every day | ORAL | 1 refills | Status: DC

## 2017-07-01 NOTE — ED Triage Notes (Signed)
Pt complains of diarrhea that started 4 weeks ago. Pt took immodium 2 days ago and now feels constipated.

## 2017-07-01 NOTE — Discharge Instructions (Addendum)
Please use Miralax for moderate to severe constipation. Take this once a day for the next 2-3 days. Please also start docusate stool softener, twice a day for at least 1 week. If stools become loose, cut down to once a day for another week. If stools remain loose, cut back to 1 pill every other day for a third week. You can stop docusate thereafter and resume as needed for constipation.  To help reduce constipation and promote bowel health: 1. Drink at least 64 ounces of water each day 2. Eat plenty of fiber (fruits, vegetables, whole grains, legumes) 3. Be physically active or exercise including walking, jogging, swimming, yoga, etc. 4. For active constipation use a stool softener (docusate) or an osmotic laxative (like Miralax) each day, or as needed.   Please monitor patterns of pain and bowel movements in relation to diet/exposure at work and stress level

## 2017-07-02 NOTE — ED Provider Notes (Signed)
[facility]    CSN: [id number] Arrival date & time: 07/01/17  1901     History   Chief Complaint Chief Complaint  Patient presents with  . Abdominal Pain    HPI Veronica Adams is a 37 y.o. female history of hypertension, obesity presenting today for evaluation of abdominal pain.  Patient states that over the past month she has had changes in her bowel movements from diarrhea to sensations of constipation.  More recently she is felt constipated and having small bowel movements.  Whenever she eats she develops a burning sensation in bilateral sides.  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  Over the month her bowel movements will return back to normal, she will occasionally take Imodium.  She took 2 Imodium yesterday and 2 mg today.  Now she feels like she cannot have a bowel movement.  Last bowel movement was this morning and was small.  She associates relief of her pain with use of the bathroom.  Denies nausea or vomiting.  Denies chest discomfort or burning sensation chest.  Patient also requesting refills of her blood pressure medicines, states that she has been out of these for 2 months.  HPI  Past Medical History:  Diagnosis Date  . Bronchitis   . Hypertension   . Obesity     Patient Active Problem List   Diagnosis Date Noted  . Essential hypertension 01/15/2015  . Left breast abscess 01/15/2015    Past Surgical History:  Procedure Laterality Date  . CESAREAN SECTION    . LAPAROSCOPY FOR ECTOPIC PREGNANCY      OB History    Gravida  3   Para      Term      Preterm      AB  2   Living  1     SAB      TAB  1   Ectopic  1   Multiple      Live Births  1            Home Medications    Prior to Admission medications   Medication Sig Start Date End Date Taking? Authorizing Provider  acetaminophen (TYLENOL) 500 MG tablet Take 1,000 mg by mouth  every 6 (six) hours as needed for mild pain.   Yes [provider]  dicyclomine (BENTYL) 20 MG tablet Take 1 tablet (20 mg total) by mouth 2 (two) times daily as needed (Abdominal cramping). 01/06/17   Muthersbaugh, Dahlia Client, PA-C  hydrochlorothiazide (HYDRODIURIL) 25 MG tablet Take 1 tablet (25 mg total) by mouth daily. 07/01/17   Austen Oyster C, PA-C  lisinopril (PRINIVIL,ZESTRIL) 20 MG tablet Take 1 tablet (20 mg total) by mouth daily. 07/01/17   Fable Huisman, Junius Creamer, PA-C    Family History History reviewed. No pertinent family history.  Social History Social History   Tobacco Use  . Smoking status: Current Every Day Smoker    Packs/day: 0.50    Types: Cigarettes  . Smokeless tobacco: Never Used  Substance Use Topics  . Alcohol use: Yes    Comment: socially   . Drug use: Yes    Types: Marijuana     Allergies   Patient has no known allergies.   Review of Systems Review of Systems  Constitutional: Negative for fever.  Respiratory: Negative for cough and shortness of breath.   Cardiovascular: Negative for chest pain.  Gastrointestinal: Positive for abdominal pain and constipation. Negative for diarrhea, nausea and vomiting.  Genitourinary: Negative for dysuria, flank pain, menstrual problem, vaginal bleeding and vaginal pain.  Musculoskeletal: Negative for back pain.  Skin: Negative for rash.  Neurological: Negative for dizziness, light-headedness and headaches.     Physical Exam Triage Vital Signs ED Triage Vitals  Enc Vitals Group     BP 07/01/17 2026 (!) 165/124     Pulse Rate 07/01/17 2023 78     Resp 07/01/17 2023 18     Temp 07/01/17 2023 98.2 F (36.8 C)     Temp Source 07/01/17 2023 Oral     SpO2 07/01/17 2023 97 %     Weight 07/01/17 2029 288 lb (130.6 kg)     Height 07/01/17 2029 5\' 5"  (1.651 m)     Head Circumference --      Peak Flow --      Pain Score 07/01/17 2028 0     Pain Loc --      Pain Edu? --      Excl. in GC? --    No data  found.  Updated Vital Signs BP (!) 165/124 (BP Location: Left Arm) Comment: patient denies headache  Pulse 78   Temp 98.2 F (36.8 C) (Oral)   Resp 18   Ht 5\' 5"  (1.651 m)   Wt 288 lb (130.6 kg)   LMP 05/31/2017   SpO2 97%   BMI 47.93 kg/m   Visual Acuity Right Eye Distance:   Left Eye Distance:   Bilateral Distance:    Right Eye Near:   Left Eye Near:    Bilateral Near:     Physical Exam  Constitutional: She appears well-developed and well-nourished. No distress.  HENT:  Head: Normocephalic and atraumatic.  Eyes: Conjunctivae are normal.  Neck: Neck supple.  Cardiovascular: Normal rate and regular rhythm.  No murmur heard. Pulmonary/Chest: Effort normal and breath sounds normal. No respiratory distress.  Abdominal: Soft. There is no tenderness.  Sounds present throughout all 4 quadrants, nontender to palpation light and deep palpation throughout all 4 quadrants and epigastrium.  Musculoskeletal: She exhibits no edema.  Neurological: She is alert.  Skin: Skin is warm and dry.  Psychiatric: She has a normal mood and affect.  Nursing note and vitals reviewed.    UC Treatments / Results  Labs (all labs ordered are listed, but only abnormal results are displayed) Labs Reviewed - No data to display  EKG None  Radiology No results found.  Procedures Procedures (including critical care time)  Medications Ordered in UC Medications - No data to display  Initial Impression / Assessment and Plan / UC Course  I have reviewed the triage vital signs and the nursing notes.  Pertinent labs & imaging results that were available during my care of the patient were reviewed by me and considered in my medical decision making (see chart for details).     Abdominal pain sensation of constipation likely related to recent use of Imodium.  Discussed to only use this when she is having very frequent watery diarrhea, advised to only due to initially and then 1.  Patient may  also have underlying irritable bowel syndrome given frequent pal changes as well as improvement in pain with bowel movements.  At this time discussed recommendations for constipation.  Discussed working on getting set up with a PCP.Discussed strict return precautions. Patient verbalized understanding and is agreeable with plan.  Final Clinical Impressions(s) / UC Diagnoses  Final diagnoses:  Generalized abdominal pain     Discharge Instructions     Please use Miralax for moderate to severe constipation. Take this once a day for the next 2-3 days. Please also start docusate stool softener, twice a day for at least 1 week. If stools become loose, cut down to once a day for another week. If stools remain loose, cut back to 1 pill every other day for a third week. You can stop docusate thereafter and resume as needed for constipation.  To help reduce constipation and promote bowel health: 1. Drink at least 64 ounces of water each day 2. Eat plenty of fiber (fruits, vegetables, whole grains, legumes) 3. Be physically active or exercise including walking, jogging, swimming, yoga, etc. 4. For active constipation use a stool softener (docusate) or an osmotic laxative (like Miralax) each day, or as needed.   Please monitor patterns of pain and bowel movements in relation to diet/exposure at work and stress level   ED Prescriptions    Medication Sig Dispense Auth. Provider   lisinopril (PRINIVIL,ZESTRIL) 20 MG tablet  (Status: Discontinued) Take 1 tablet (20 mg total) by mouth daily. 30 tablet Teagan Heidrick C, PA-C   hydrochlorothiazide (HYDRODIURIL) 25 MG tablet  (Status: Discontinued) Take 1 tablet (25 mg total) by mouth daily. 30 tablet Quincy Prisco C, PA-C   hydrochlorothiazide (HYDRODIURIL) 25 MG tablet Take 1 tablet (25 mg total) by mouth daily. 30 tablet Jahvon Gosline C, PA-C   lisinopril (PRINIVIL,ZESTRIL) 20 MG tablet Take 1 tablet (20 mg total) by mouth daily. 30 tablet Wealthy Danielski,  [place] C, PA-C     Controlled Substance Prescriptions [place] Controlled Substance Registry consulted? Not Applicable   Lew Dawes, [place] 07/02/17 1132

## 2018-04-02 LAB — TRICHOMONAS (~~LOC~~): TRICHOMONAS: NEGATIVE

## 2018-04-02 LAB — OB RESULTS CONSOLE GC/CHLAMYDIA
Chlamydia: NEGATIVE
Gonorrhea: NEGATIVE

## 2018-04-02 LAB — OB RESULTS CONSOLE HGB/HCT, BLOOD
HEMATOCRIT: 37 (ref 29–41)
Hemoglobin: 12.3

## 2018-04-02 LAB — OB RESULTS CONSOLE PLATELET COUNT: Platelets: 257

## 2018-04-02 LAB — GLUCOSE, 1 HOUR: Glucose 1 Hour: 98

## 2018-04-02 LAB — OB RESULTS CONSOLE ABO/RH: RH TYPE: POSITIVE

## 2018-04-02 LAB — OB RESULTS CONSOLE RPR: RPR: NONREACTIVE

## 2018-04-02 LAB — OB RESULTS CONSOLE VARICELLA ZOSTER ANTIBODY, IGG: VARICELLA IGG: IMMUNE

## 2018-04-02 LAB — CYTOLOGY - PAP: Pap: NEGATIVE

## 2018-04-02 LAB — OB RESULTS CONSOLE ANTIBODY SCREEN: Antibody Screen: NEGATIVE

## 2018-04-09 ENCOUNTER — Encounter: Payer: Self-pay | Admitting: *Deleted

## 2018-04-09 ENCOUNTER — Encounter (HOSPITAL_COMMUNITY): Payer: Self-pay

## 2018-04-09 ENCOUNTER — Ambulatory Visit (HOSPITAL_COMMUNITY): Payer: Medicaid Other | Attending: Family

## 2018-04-11 ENCOUNTER — Other Ambulatory Visit: Payer: Self-pay

## 2018-04-11 ENCOUNTER — Encounter: Payer: Self-pay | Admitting: Family Medicine

## 2018-04-11 ENCOUNTER — Ambulatory Visit (INDEPENDENT_AMBULATORY_CARE_PROVIDER_SITE_OTHER): Payer: Medicaid Other | Admitting: Family Medicine

## 2018-04-11 VITALS — BP 165/83 | HR 77 | Wt 299.2 lb

## 2018-04-11 DIAGNOSIS — O34219 Maternal care for unspecified type scar from previous cesarean delivery: Secondary | ICD-10-CM | POA: Diagnosis not present

## 2018-04-11 DIAGNOSIS — O0992 Supervision of high risk pregnancy, unspecified, second trimester: Secondary | ICD-10-CM | POA: Diagnosis present

## 2018-04-11 DIAGNOSIS — O0990 Supervision of high risk pregnancy, unspecified, unspecified trimester: Secondary | ICD-10-CM

## 2018-04-11 DIAGNOSIS — O10019 Pre-existing essential hypertension complicating pregnancy, unspecified trimester: Secondary | ICD-10-CM

## 2018-04-11 DIAGNOSIS — Z98891 History of uterine scar from previous surgery: Secondary | ICD-10-CM | POA: Insufficient documentation

## 2018-04-11 DIAGNOSIS — O99332 Smoking (tobacco) complicating pregnancy, second trimester: Secondary | ICD-10-CM | POA: Diagnosis not present

## 2018-04-11 DIAGNOSIS — Z3A23 23 weeks gestation of pregnancy: Secondary | ICD-10-CM

## 2018-04-11 DIAGNOSIS — O10012 Pre-existing essential hypertension complicating pregnancy, second trimester: Secondary | ICD-10-CM | POA: Diagnosis not present

## 2018-04-11 DIAGNOSIS — O09522 Supervision of elderly multigravida, second trimester: Secondary | ICD-10-CM

## 2018-04-11 DIAGNOSIS — O99330 Smoking (tobacco) complicating pregnancy, unspecified trimester: Secondary | ICD-10-CM | POA: Insufficient documentation

## 2018-04-11 DIAGNOSIS — J4520 Mild intermittent asthma, uncomplicated: Secondary | ICD-10-CM

## 2018-04-11 LAB — POCT URINALYSIS DIP (DEVICE)
Bilirubin Urine: NEGATIVE
GLUCOSE, UA: NEGATIVE mg/dL
HGB URINE DIPSTICK: NEGATIVE
Ketones, ur: NEGATIVE mg/dL
LEUKOCYTE UA: NEGATIVE
NITRITE: NEGATIVE
Protein, ur: NEGATIVE mg/dL
UROBILINOGEN UA: 0.2 mg/dL (ref 0.0–1.0)
pH: 5.5 (ref 5.0–8.0)

## 2018-04-11 MED ORDER — LABETALOL HCL 200 MG PO TABS: 100.0000 mg | ORAL_TABLET | Freq: Two times a day (BID) | ORAL | 5 refills | Status: DC

## 2018-04-11 NOTE — Patient Instructions (Signed)

## 2018-04-12 DIAGNOSIS — O09529 Supervision of elderly multigravida, unspecified trimester: Secondary | ICD-10-CM | POA: Insufficient documentation

## 2018-04-12 LAB — COMPREHENSIVE METABOLIC PANEL
ALK PHOS: 61 IU/L (ref 39–117)
ALT: 17 IU/L (ref 0–32)
AST: 27 IU/L (ref 0–40)
Albumin/Globulin Ratio: 1.4 (ref 1.2–2.2)
Albumin: 3.6 g/dL — ABNORMAL LOW (ref 3.8–4.8)
BUN / CREAT RATIO: 11 (ref 9–23)
BUN: 7 mg/dL (ref 6–20)
Bilirubin Total: <0.2 mg/dL (ref 0.0–1.2)
CALCIUM: 9.3 mg/dL (ref 8.7–10.2)
CO2: 18 mmol/L — AB (ref 20–29)
CREATININE: 0.66 mg/dL (ref 0.57–1.00)
Chloride: 103 mmol/L (ref 96–106)
GFR calc Af Amer: 130 mL/min/{1.73_m2} (ref >59)
GFR, EST NON AFRICAN AMERICAN: 112 mL/min/{1.73_m2} (ref >59)
GLUCOSE: 87 mg/dL (ref 65–99)
Globulin, Total: 2.5 g/dL (ref 1.5–4.5)
Potassium: 4.4 mmol/L (ref 3.5–5.2)
SODIUM: 138 mmol/L (ref 134–144)
Total Protein: 6.1 g/dL (ref 6.0–8.5)

## 2018-04-12 LAB — PROTEIN / CREATININE RATIO, URINE
CREATININE, UR: 244.1 mg/dL
Protein, Ur: 15.1 mg/dL
Protein/Creat Ratio: 62 mg/g creat (ref 0–200)

## 2018-04-12 NOTE — Progress Notes (Signed)
   PRENATAL VISIT NOTE  Subjective:  Veronica Adams is a 38 y.o. G7P1051 at [redacted]w[redacted]d being seen today for ongoing prenatal care.  She is currently monitored for the following issues for this high-risk pregnancy and has Hypertension in pregnancy, antepartum; Left breast abscess; Supervision of high risk pregnancy, antepartum; Previous cesarean delivery affecting pregnancy, antepartum; Tobacco use affecting pregnancy, antepartum; and AMA (advanced maternal age) multigravida 35+ on their problem list.  Patient reports no complaints.  Contractions: Not present. Vag. Bleeding: None.  Movement: Present. Denies leaking of fluid.   The following portions of the patient's history were reviewed and updated as appropriate: allergies, current medications, past family history, past medical history, past social history, past surgical history and problem list.   Objective:   Vitals:   04/11/18 1029  BP: (!) 165/83  Pulse: 77  Weight: 299 lb 3.2 oz (135.7 kg)    Fetal Status: Fetal Heart Rate (bpm): 147   Movement: Present     General:  Alert, oriented and cooperative. Patient is in no acute distress.  Skin: Skin is warm and dry. No rash noted.   Cardiovascular: Normal heart rate noted  Respiratory: Normal respiratory effort, no problems with respiration noted  Abdomen: Soft, gravid, appropriate for gestational age.  Pain/Pressure: Absent     Pelvic: Cervical exam deferred        Extremities: Normal range of motion.  Edema: None  Mental Status: Normal mood and affect. Normal behavior. Normal judgment and thought content.   Assessment and Plan:  Pregnancy: G7P1051 at [redacted]w[redacted]d 1. Supervision of high risk pregnancy, antepartum Due to COVID 19--given cuff to decrease her need for in office appointments--to send MyChart message if increasing B-blocker worsens her Asthma - [facility] AMB BABYSCRIPTS SCHEDULE OPTIMIZATION - Genetic Screening - US MFM OB DETAIL +14 WK; Future  2. Previous cesarean delivery  affecting pregnancy, antepartum Got to complete and pushing and FHR decels. Risks reivewed, leaning toward TOLAC  3. Tobacco use affecting pregnancy, antepartum Smoking and tobacco cessation was discussed at today's visit for 5 minutes     4. Pre-existing essential hypertension during pregnancy, antepartum Increase Labetalol to 200 bid--rx written, risks reviewed of elevated BP Discussed serial u/s for growth, risks of pre-eclampsia DASH diet reviewed On Aspirin - Comprehensive metabolic panel - Protein / creatinine ratio, urine - labetalol (NORMODYNE) 200 MG tablet; Take 0.5 tablets (100 mg total) by mouth 2 (two) times daily.  Dispense: 180 tablet; Refill: 5  5. Multigravida of advanced maternal age in second trimester NIPT today  Preterm labor symptoms and general obstetric precautions including but not limited to vaginal bleeding, contractions, leaking of fluid and fetal movement were reviewed in detail with the patient. Please refer to After Visit Summary for other counseling recommendations.   Return in 4 weeks (on 05/09/2018) for wks in person with 2 hour, 2 wks - virtual.  Future Appointments  Date Time Provider Department Center  04/26/2018  9:35 AM Willodean Rosenthal, MD [facility]  05/02/2018 10:30 AM [facility] [place] 1 [facility]US [facility]US  05/10/2018  8:50 AM [facility] LAB [facility]  05/10/2018  9:15 AM Adam Phenix, MD [facility]    Reva Bores, MD

## 2018-04-18 ENCOUNTER — Telehealth: Payer: Self-pay | Admitting: *Deleted

## 2018-04-18 NOTE — Telephone Encounter (Signed)
Veronica Adams left a message this pm that she has not received the blood pressure cuff yet from [employer] yet so hasn't been able to do a blood pressure yet.

## 2018-04-19 NOTE — Telephone Encounter (Signed)
I called Veronica Adams and left  A message we got your message and am calling you back. We will have someone look into issue and get back in touch with you if needed.

## 2018-04-26 ENCOUNTER — Encounter: Payer: Medicaid Other | Admitting: Obstetrics & Gynecology

## 2018-05-02 ENCOUNTER — Other Ambulatory Visit: Payer: Self-pay

## 2018-05-02 ENCOUNTER — Encounter (HOSPITAL_COMMUNITY): Payer: Self-pay

## 2018-05-02 ENCOUNTER — Ambulatory Visit (HOSPITAL_COMMUNITY)
Admission: RE | Admit: 2018-05-02 | Discharge: 2018-05-02 | Disposition: A | Discharge status: Home / Self Care | Payer: Medicaid Other | Source: Ambulatory Visit | Attending: Family Medicine | Admitting: Family Medicine

## 2018-05-02 ENCOUNTER — Ambulatory Visit (HOSPITAL_COMMUNITY): Payer: Medicaid Other | Admitting: *Deleted

## 2018-05-02 VITALS — BP 170/95 | HR 75 | Temp 98.6°F

## 2018-05-02 DIAGNOSIS — O0990 Supervision of high risk pregnancy, unspecified, unspecified trimester: Secondary | ICD-10-CM | POA: Insufficient documentation

## 2018-05-02 DIAGNOSIS — O34219 Maternal care for unspecified type scar from previous cesarean delivery: Secondary | ICD-10-CM

## 2018-05-02 DIAGNOSIS — O10919 Unspecified pre-existing hypertension complicating pregnancy, unspecified trimester: Secondary | ICD-10-CM | POA: Diagnosis present

## 2018-05-02 DIAGNOSIS — O99332 Smoking (tobacco) complicating pregnancy, second trimester: Secondary | ICD-10-CM

## 2018-05-02 DIAGNOSIS — Z363 Encounter for antenatal screening for malformations: Secondary | ICD-10-CM

## 2018-05-02 DIAGNOSIS — O09522 Supervision of elderly multigravida, second trimester: Secondary | ICD-10-CM | POA: Diagnosis not present

## 2018-05-02 DIAGNOSIS — O09523 Supervision of elderly multigravida, third trimester: Secondary | ICD-10-CM | POA: Insufficient documentation

## 2018-05-02 DIAGNOSIS — O99212 Obesity complicating pregnancy, second trimester: Secondary | ICD-10-CM

## 2018-05-02 DIAGNOSIS — O09292 Supervision of pregnancy with other poor reproductive or obstetric history, second trimester: Secondary | ICD-10-CM | POA: Diagnosis not present

## 2018-05-02 DIAGNOSIS — Z3A26 26 weeks gestation of pregnancy: Secondary | ICD-10-CM

## 2018-05-03 ENCOUNTER — Other Ambulatory Visit (HOSPITAL_COMMUNITY): Payer: Self-pay | Admitting: *Deleted

## 2018-05-03 DIAGNOSIS — O10913 Unspecified pre-existing hypertension complicating pregnancy, third trimester: Secondary | ICD-10-CM

## 2018-05-04 ENCOUNTER — Ambulatory Visit: Payer: Medicaid Other

## 2018-05-07 ENCOUNTER — Telehealth: Payer: Self-pay | Admitting: *Deleted

## 2018-05-07 DIAGNOSIS — O10019 Pre-existing essential hypertension complicating pregnancy, unspecified trimester: Secondary | ICD-10-CM

## 2018-05-07 MED ORDER — LABETALOL HCL 200 MG PO TABS: 400.0000 mg | ORAL_TABLET | Freq: Two times a day (BID) | ORAL | 3 refills | Status: DC

## 2018-05-07 NOTE — Telephone Encounter (Signed)
Pt calling because she was told to increase her Labetalol to 400mg  per day but was not given a prescription for that and was therefore doubling her previous prescription and is now out early and the pharmacy will not fill it.  Pt requesting prescription for Labetalol 400mg .

## 2018-05-07 NOTE — Telephone Encounter (Signed)
Called patient stating I am returning her phone call and asked if she was supposed to be taking labetalol 200mg  BID. Patient states no she saw a man doctor last week who told her to take 400mg  BID. Per review, patient was in MFM. Discussed with Dr Alysia Penna and okay to send in Rx for labetalol 400mg  BID. Patient informed. Asked patient about entering in blood pressures into [facility] and she states she isn't sure if she downloaded the app yet and just received the cuff in the mail. Told patient to download the app now and I would call her back shortly and walk her through entering her blood pressure. Patient verbalized understanding.   Called patient back and she states she is currently trying to reset her password because she forgot it but she thinks she can figure it out. Told patient I would monitor for her BP tomorrow. Patient had no questions.

## 2018-05-09 ENCOUNTER — Telehealth: Payer: Self-pay | Admitting: Obstetrics and Gynecology

## 2018-05-09 NOTE — Telephone Encounter (Signed)
Left the patient a detailed voicemail message regarding the 2 hour lab and ob appointment scheduled for 4/23.

## 2018-05-10 ENCOUNTER — Other Ambulatory Visit: Payer: Self-pay

## 2018-05-10 ENCOUNTER — Other Ambulatory Visit: Payer: Medicaid Other

## 2018-05-10 ENCOUNTER — Other Ambulatory Visit: Payer: Self-pay | Admitting: General Practice

## 2018-05-10 ENCOUNTER — Ambulatory Visit (INDEPENDENT_AMBULATORY_CARE_PROVIDER_SITE_OTHER): Payer: Medicaid Other | Admitting: Obstetrics & Gynecology

## 2018-05-10 VITALS — BP 156/105 | HR 83 | Wt 304.2 lb

## 2018-05-10 DIAGNOSIS — O0992 Supervision of high risk pregnancy, unspecified, second trimester: Secondary | ICD-10-CM | POA: Diagnosis present

## 2018-05-10 DIAGNOSIS — Z3A27 27 weeks gestation of pregnancy: Secondary | ICD-10-CM

## 2018-05-10 DIAGNOSIS — Z23 Encounter for immunization: Secondary | ICD-10-CM

## 2018-05-10 DIAGNOSIS — O0990 Supervision of high risk pregnancy, unspecified, unspecified trimester: Secondary | ICD-10-CM

## 2018-05-10 DIAGNOSIS — O10012 Pre-existing essential hypertension complicating pregnancy, second trimester: Secondary | ICD-10-CM

## 2018-05-10 DIAGNOSIS — O10019 Pre-existing essential hypertension complicating pregnancy, unspecified trimester: Secondary | ICD-10-CM

## 2018-05-10 MED ORDER — LABETALOL HCL 200 MG PO TABS: 600.0000 mg | ORAL_TABLET | Freq: Two times a day (BID) | ORAL | 3 refills | Status: DC

## 2018-05-10 NOTE — Patient Instructions (Signed)

## 2018-05-10 NOTE — Progress Notes (Unsigned)
Pt left before last draw for 2 hr GTT. Please schedule for retake.   [employer] [facility], CPT Phlebotomist

## 2018-05-10 NOTE — Progress Notes (Signed)
   PRENATAL VISIT NOTE  Subjective:  Veronica Adams is a 38 y.o. G7P1051 at [redacted]w[redacted]d being seen today for ongoing prenatal care.  She is currently monitored for the following issues for this high-risk pregnancy and has Hypertension in pregnancy, antepartum; Left breast abscess; Supervision of high risk pregnancy, antepartum; Previous cesarean delivery affecting pregnancy, antepartum; Tobacco use affecting pregnancy, antepartum; and AMA (advanced maternal age) multigravida 35+ on their problem list.  Patient reports headache.  Contractions: Not present. Vag. Bleeding: None.  Movement: Present. Denies leaking of fluid.   The following portions of the patient's history were reviewed and updated as appropriate: allergies, current medications, past family history, past medical history, past social history, past surgical history and problem list.   Objective:   Vitals:   05/10/18 0915 05/10/18 0920  BP: (!) 161/94 (!) 156/105  Pulse: 76 83  Weight: (!) 304 lb 3.2 oz (138 kg)     Fetal Status: Fetal Heart Rate (bpm): 143 Fundal Height: 30 cm Movement: Present     General:  Alert, oriented and cooperative. Patient is in no acute distress.  Skin: Skin is warm and dry. No rash noted.   Cardiovascular: Normal heart rate noted  Respiratory: Normal respiratory effort, no problems with respiration noted  Abdomen: Soft, gravid, appropriate for gestational age.  Pain/Pressure: Absent     Pelvic: Cervical exam deferred        Extremities: Normal range of motion.  Edema: None  Mental Status: Normal mood and affect. Normal behavior. Normal judgment and thought content.   Assessment and Plan:  Pregnancy: G7P1051 at [redacted]w[redacted]d 1. Supervision of high risk pregnancy, antepartum routine - Tdap vaccine greater than or equal to 7yo IM Increase labetalol to 600 mg BID Preterm labor symptoms and general obstetric precautions including but not limited to vaginal bleeding, contractions, leaking of fluid and fetal  movement were reviewed in detail with the patient. Please refer to After Visit Summary for other counseling recommendations.   Return in about 1 week (around 05/17/2018) for televisit.  Future Appointments  Date Time Provider Department Center  05/30/2018 11:00 AM [facility] [place] 3 [facility]-M[facility] MFC-US    Scheryl Darter, MD

## 2018-05-11 ENCOUNTER — Other Ambulatory Visit: Payer: Medicaid Other

## 2018-05-11 ENCOUNTER — Other Ambulatory Visit: Payer: Self-pay | Admitting: *Deleted

## 2018-05-11 DIAGNOSIS — O0990 Supervision of high risk pregnancy, unspecified, unspecified trimester: Secondary | ICD-10-CM

## 2018-05-11 LAB — GLUCOSE TOLERANCE, 2 HOURS W/ 1HR
Glucose, 1 hour: 125 mg/dL (ref 65–179)
Glucose, Fasting: 87 mg/dL (ref 65–91)

## 2018-05-21 ENCOUNTER — Other Ambulatory Visit: Payer: Self-pay

## 2018-05-21 ENCOUNTER — Ambulatory Visit (INDEPENDENT_AMBULATORY_CARE_PROVIDER_SITE_OTHER): Payer: Medicaid Other | Admitting: Obstetrics and Gynecology

## 2018-05-21 ENCOUNTER — Telehealth: Payer: Self-pay | Admitting: Obstetrics and Gynecology

## 2018-05-21 ENCOUNTER — Encounter: Payer: Self-pay | Admitting: Family Medicine

## 2018-05-21 ENCOUNTER — Ambulatory Visit (INDEPENDENT_AMBULATORY_CARE_PROVIDER_SITE_OTHER): Payer: Medicaid Other | Admitting: *Deleted

## 2018-05-21 VITALS — BP 154/124 | HR 72

## 2018-05-21 VITALS — BP 148/104

## 2018-05-21 DIAGNOSIS — O10013 Pre-existing essential hypertension complicating pregnancy, third trimester: Secondary | ICD-10-CM

## 2018-05-21 DIAGNOSIS — O99213 Obesity complicating pregnancy, third trimester: Secondary | ICD-10-CM

## 2018-05-21 DIAGNOSIS — O09523 Supervision of elderly multigravida, third trimester: Secondary | ICD-10-CM | POA: Diagnosis not present

## 2018-05-21 DIAGNOSIS — O0990 Supervision of high risk pregnancy, unspecified, unspecified trimester: Secondary | ICD-10-CM

## 2018-05-21 DIAGNOSIS — Z3A28 28 weeks gestation of pregnancy: Secondary | ICD-10-CM

## 2018-05-21 DIAGNOSIS — O09293 Supervision of pregnancy with other poor reproductive or obstetric history, third trimester: Secondary | ICD-10-CM

## 2018-05-21 DIAGNOSIS — O09299 Supervision of pregnancy with other poor reproductive or obstetric history, unspecified trimester: Secondary | ICD-10-CM

## 2018-05-21 DIAGNOSIS — O99210 Obesity complicating pregnancy, unspecified trimester: Secondary | ICD-10-CM

## 2018-05-21 DIAGNOSIS — O34219 Maternal care for unspecified type scar from previous cesarean delivery: Secondary | ICD-10-CM

## 2018-05-21 DIAGNOSIS — O10019 Pre-existing essential hypertension complicating pregnancy, unspecified trimester: Secondary | ICD-10-CM

## 2018-05-21 DIAGNOSIS — Z0130 Encounter for examination of blood pressure without abnormal findings: Secondary | ICD-10-CM

## 2018-05-21 DIAGNOSIS — O10913 Unspecified pre-existing hypertension complicating pregnancy, third trimester: Secondary | ICD-10-CM

## 2018-05-21 DIAGNOSIS — O10919 Unspecified pre-existing hypertension complicating pregnancy, unspecified trimester: Secondary | ICD-10-CM

## 2018-05-21 DIAGNOSIS — Z6841 Body Mass Index (BMI) 40.0 and over, adult: Secondary | ICD-10-CM | POA: Insufficient documentation

## 2018-05-21 NOTE — Progress Notes (Signed)
TELEHEALTH VIRTUAL OBSTETRICS VISIT ENCOUNTER NOTE  I connected with Veronica Adams on 05/21/18 at 10:15 AM EDT by telephone at home and verified that I am speaking with the correct person using two identifiers.   I discussed the limitations, risks, security and privacy concerns of performing an evaluation and management service by telephone and the availability of in person appointments. I also discussed with the patient that there may be a patient responsible charge related to this service. The patient expressed understanding and agreed to proceed.  Subjective:  Veronica Adams is a 38 y.o. G7P1051 at 6252w6d being followed for ongoing prenatal care.  She is currently monitored for the following issues for this high-risk pregnancy and has Chronic hypertension affecting pregnancy; Left breast abscess; Supervision of high risk pregnancy, antepartum; Previous cesarean delivery affecting pregnancy, antepartum; Tobacco use affecting pregnancy, antepartum; AMA (advanced maternal age) multigravida 35+; Obesity in pregnancy; BMI 40.0-44.9, adult (HCC); and History of intrauterine growth restriction in prior pregnancy, currently pregnant on their problem list.  Patient reports she denies any s/s of pre-eclampsia. Reports fetal movement. Denies any contractions, bleeding or leaking of fluid.   The following portions of the patient's history were reviewed and updated as appropriate: allergies, current medications, past family history, past medical history, past social history, past surgical history and problem list.   Objective:   Vitals:   05/21/18 0952 05/21/18 1002  BP: (!) 146/78 (!) 152/119  Pulse: 72     General:  Alert, oriented and cooperative.   Mental Status: Normal mood and affect perceived. Normal judgment and thought content.  Rest of physical exam deferred due to type of encounter  Assessment and Plan:  Pregnancy: G7P1051 at 3652w6d 1. Supervision of high risk pregnancy,  antepartum Routine care. Didn't stay for last 2h GTT value. Can have her do when she comes for her growth u/s next week.  2. Chronic hypertension affecting pregnancy Pt on labetalol 600 bid and asa 81. She states she took her am dose today for both. Will have the front desk see if we can have her come in for bp check in clinic to confirm. If not then will have to send to mau for eval.   3. Previous cesarean delivery affecting pregnancy, antepartum D/w pt re: delivery route nv  4. Multigravida of advanced maternal age in third trimester cffdna not able to be run back in late march D/w pt and can have her re draw when she comes in next  5. Obesity in pregnancy  6. BMI 40.0-44.9, adult (HCC)  7. History of intrauterine growth restriction in prior pregnancy, currently pregnant Already getting serial u/s for cHTN  Preterm labor symptoms and general obstetric precautions including but not limited to vaginal bleeding, contractions, leaking of fluid and fetal movement were reviewed in detail with the patient.  I discussed the assessment and treatment plan with the patient. The patient was provided an opportunity to ask questions and all were answered. The patient agreed with the plan and demonstrated an understanding of the instructions. The patient was advised to call back or seek an in-person office evaluation/[facility] for any urgent or concerning symptoms. Please refer to After Visit Summary for other counseling recommendations.   I provided 10 minutes of non-face-to-face time during this encounter. The visit was conducted via Webex-medicine  Return in about 9 days (around 05/30/2018) for hrob, in person. 2h GTT. has u/s that day already.  Future Appointments  Date  Time Provider Department Center  05/21/2018 10:15 AM [place] Bing, MD WOC[facility] WOC  05/30/2018 11:00 AM [facility] [place] 3 WH-M[facility] MFC-US    [place] Bing, MD Center for [employer], [facility]

## 2018-05-21 NOTE — Progress Notes (Signed)
Pt presents for BP Check.  Pt BP's elevated earlier today at home.  Pt asymptomatic.  Pt BP 148/104 on her lower arm as the cuff was too small for the upper arm.  Pt states she takes her Labetalol 600mg  BID.  Reviewed with Dr. Adrian Blackwater.  Pt to increase Labetalol to 600mg  TID.  Pt verbalized understanding.  Reviewed with Dr. Adrian Blackwater that the 2 hour blood draw from her 2 hr gtt was not drawn.  He recommended pt do a 1 hour at her next visit. Panorama drawn today.

## 2018-05-21 NOTE — Telephone Encounter (Signed)
Called the patient to inform of the scheduled appointment for today. Scheduled a bp check and panorama draw per the check out notes. Informed the patient of coming in at 4 pm. Also informed the lab tech Grover Canavan, she stated the patient can come in at any time, advised the patient of the 4:20 appointment however she can come in at 4:00 to start the panorama draw. Stated she is aware of the new location.

## 2018-05-21 NOTE — Progress Notes (Signed)
I connected with  [name] on 05/21/18 at 10:15 AM EDT by telephone and verified that I am speaking with the correct person using two identifiers.   I discussed the limitations, risks, security and privacy concerns of performing an evaluation and management service by telephone and the availability of in person appointments. I also discussed with the patient that there may be a patient responsible charge related to this service. The patient expressed understanding and agreed to proceed. I had patient repeat bp due to elevated. I also instructed patient will need to repeat 2 hr gtt since it was not completed at last visit ( 2 hour not drawn). Bp elevated on repeat- denies headache, edema .  Linda,RN 05/21/2018  9:49 AM

## 2018-05-21 NOTE — Telephone Encounter (Signed)
Called the patient to inform of the upcoming virtual visit. Left a detailed voicemail message educating to download the [employer] webex app.

## 2018-05-22 LAB — CBC
Hematocrit: 34.7 % (ref 34.0–46.6)
Hemoglobin: 12.4 g/dL (ref 11.1–15.9)
MCH: 31.9 pg (ref 26.6–33.0)
MCHC: 35.7 g/dL (ref 31.5–35.7)
MCV: 89 fL (ref 79–97)
Platelets: 220 10*3/uL (ref 150–450)
RBC: 3.89 x10E6/uL (ref 3.77–5.28)
RDW: 13.1 % (ref 11.7–15.4)
WBC: 6.9 10*3/uL (ref 3.4–10.8)

## 2018-05-22 LAB — HIV ANTIBODY (ROUTINE TESTING W REFLEX): HIV Screen 4th Generation wRfx: NONREACTIVE

## 2018-05-22 LAB — RPR: RPR Ser Ql: NONREACTIVE

## 2018-05-22 NOTE — Progress Notes (Signed)
Chart reviewed - agree with RN documentation.   

## 2018-05-28 ENCOUNTER — Telehealth: Payer: Self-pay | Admitting: Obstetrics & Gynecology

## 2018-05-28 NOTE — Telephone Encounter (Signed)
Called the patient to confirm the appointment, the patient verbalized understanding. °

## 2018-05-30 ENCOUNTER — Other Ambulatory Visit: Payer: Medicaid Other

## 2018-05-30 ENCOUNTER — Telehealth: Payer: Self-pay | Admitting: Lactation Services

## 2018-05-30 ENCOUNTER — Encounter (HOSPITAL_COMMUNITY): Payer: Self-pay

## 2018-05-30 ENCOUNTER — Ambulatory Visit (HOSPITAL_COMMUNITY)
Admission: RE | Admit: 2018-05-30 | Discharge: 2018-05-30 | Disposition: A | Discharge status: Home / Self Care | Payer: Medicaid Other | Source: Ambulatory Visit | Attending: Obstetrics and Gynecology | Admitting: Obstetrics and Gynecology

## 2018-05-30 ENCOUNTER — Other Ambulatory Visit: Payer: Self-pay

## 2018-05-30 ENCOUNTER — Other Ambulatory Visit (HOSPITAL_COMMUNITY): Payer: Self-pay | Admitting: Maternal & Fetal Medicine

## 2018-05-30 ENCOUNTER — Inpatient Hospital Stay (HOSPITAL_COMMUNITY)
Admission: AD | Admit: 2018-05-30 | Discharge: 2018-06-01 | DRG: 833 | Discharge status: Against Medical Advice | Payer: Medicaid Other | Attending: Obstetrics & Gynecology | Admitting: Obstetrics & Gynecology

## 2018-05-30 ENCOUNTER — Other Ambulatory Visit (HOSPITAL_COMMUNITY): Payer: Self-pay | Admitting: *Deleted

## 2018-05-30 ENCOUNTER — Ambulatory Visit (HOSPITAL_COMMUNITY): Payer: Medicaid Other | Admitting: *Deleted

## 2018-05-30 ENCOUNTER — Ambulatory Visit (INDEPENDENT_AMBULATORY_CARE_PROVIDER_SITE_OTHER): Payer: Medicaid Other | Admitting: Obstetrics & Gynecology

## 2018-05-30 VITALS — BP 164/84 | HR 66 | Temp 98.6°F | Wt 306.0 lb

## 2018-05-30 VITALS — BP 183/101 | HR 67 | Temp 98.2°F

## 2018-05-30 DIAGNOSIS — Z1159 Encounter for screening for other viral diseases: Secondary | ICD-10-CM | POA: Diagnosis not present

## 2018-05-30 DIAGNOSIS — O10919 Unspecified pre-existing hypertension complicating pregnancy, unspecified trimester: Secondary | ICD-10-CM | POA: Insufficient documentation

## 2018-05-30 DIAGNOSIS — O99210 Obesity complicating pregnancy, unspecified trimester: Secondary | ICD-10-CM | POA: Diagnosis present

## 2018-05-30 DIAGNOSIS — Z5329 Procedure and treatment not carried out because of patient's decision for other reasons: Secondary | ICD-10-CM | POA: Diagnosis present

## 2018-05-30 DIAGNOSIS — O10019 Pre-existing essential hypertension complicating pregnancy, unspecified trimester: Secondary | ICD-10-CM

## 2018-05-30 DIAGNOSIS — O99513 Diseases of the respiratory system complicating pregnancy, third trimester: Secondary | ICD-10-CM | POA: Diagnosis present

## 2018-05-30 DIAGNOSIS — O99333 Smoking (tobacco) complicating pregnancy, third trimester: Secondary | ICD-10-CM

## 2018-05-30 DIAGNOSIS — O09293 Supervision of pregnancy with other poor reproductive or obstetric history, third trimester: Secondary | ICD-10-CM | POA: Diagnosis not present

## 2018-05-30 DIAGNOSIS — O34219 Maternal care for unspecified type scar from previous cesarean delivery: Secondary | ICD-10-CM

## 2018-05-30 DIAGNOSIS — Z3A30 30 weeks gestation of pregnancy: Secondary | ICD-10-CM

## 2018-05-30 DIAGNOSIS — E669 Obesity, unspecified: Secondary | ICD-10-CM | POA: Diagnosis present

## 2018-05-30 DIAGNOSIS — Z98891 History of uterine scar from previous surgery: Secondary | ICD-10-CM | POA: Diagnosis present

## 2018-05-30 DIAGNOSIS — J45909 Unspecified asthma, uncomplicated: Secondary | ICD-10-CM | POA: Diagnosis present

## 2018-05-30 DIAGNOSIS — Z362 Encounter for other antenatal screening follow-up: Secondary | ICD-10-CM

## 2018-05-30 DIAGNOSIS — O10913 Unspecified pre-existing hypertension complicating pregnancy, third trimester: Secondary | ICD-10-CM | POA: Insufficient documentation

## 2018-05-30 DIAGNOSIS — O09299 Supervision of pregnancy with other poor reproductive or obstetric history, unspecified trimester: Secondary | ICD-10-CM

## 2018-05-30 DIAGNOSIS — O365990 Maternal care for other known or suspected poor fetal growth, unspecified trimester, not applicable or unspecified: Secondary | ICD-10-CM

## 2018-05-30 DIAGNOSIS — Z87891 Personal history of nicotine dependence: Secondary | ICD-10-CM | POA: Diagnosis not present

## 2018-05-30 DIAGNOSIS — O09523 Supervision of elderly multigravida, third trimester: Secondary | ICD-10-CM | POA: Diagnosis not present

## 2018-05-30 DIAGNOSIS — O99213 Obesity complicating pregnancy, third trimester: Secondary | ICD-10-CM | POA: Diagnosis present

## 2018-05-30 DIAGNOSIS — Z6841 Body Mass Index (BMI) 40.0 and over, adult: Secondary | ICD-10-CM

## 2018-05-30 DIAGNOSIS — O283 Abnormal ultrasonic finding on antenatal screening of mother: Secondary | ICD-10-CM

## 2018-05-30 DIAGNOSIS — O10013 Pre-existing essential hypertension complicating pregnancy, third trimester: Secondary | ICD-10-CM

## 2018-05-30 DIAGNOSIS — O0990 Supervision of high risk pregnancy, unspecified, unspecified trimester: Secondary | ICD-10-CM

## 2018-05-30 DIAGNOSIS — O09529 Supervision of elderly multigravida, unspecified trimester: Secondary | ICD-10-CM

## 2018-05-30 LAB — CBC WITH DIFFERENTIAL/PLATELET
Abs Immature Granulocytes: 0.02 10*3/uL (ref 0.00–0.07)
Basophils Absolute: 0 10*3/uL (ref 0.0–0.1)
Basophils Relative: 0 %
Eosinophils Absolute: 0.3 10*3/uL (ref 0.0–0.5)
Eosinophils Relative: 4 %
HCT: 37 % (ref 36.0–46.0)
Hemoglobin: 12.5 g/dL (ref 12.0–15.0)
Immature Granulocytes: 0 %
Lymphocytes Relative: 32 %
Lymphs Abs: 2.2 10*3/uL (ref 0.7–4.0)
MCH: 30.8 pg (ref 26.0–34.0)
MCHC: 33.8 g/dL (ref 30.0–36.0)
MCV: 91.1 fL (ref 80.0–100.0)
Monocytes Absolute: 0.5 10*3/uL (ref 0.1–1.0)
Monocytes Relative: 7 %
Neutro Abs: 4 10*3/uL (ref 1.7–7.7)
Neutrophils Relative %: 57 %
Platelets: 216 10*3/uL (ref 150–400)
RBC: 4.06 MIL/uL (ref 3.87–5.11)
RDW: 13.7 % (ref 11.5–15.5)
WBC: 6.9 10*3/uL (ref 4.0–10.5)
nRBC: 0 % (ref 0.0–0.2)

## 2018-05-30 LAB — URINALYSIS, ROUTINE W REFLEX MICROSCOPIC
Bilirubin Urine: NEGATIVE
Glucose, UA: NEGATIVE mg/dL
Hgb urine dipstick: NEGATIVE
Ketones, ur: 5 mg/dL — AB
Leukocytes,Ua: NEGATIVE
Nitrite: NEGATIVE
Protein, ur: NEGATIVE mg/dL
Specific Gravity, Urine: 1.019 (ref 1.005–1.030)
pH: 5 (ref 5.0–8.0)

## 2018-05-30 LAB — COMPREHENSIVE METABOLIC PANEL
ALT: 17 U/L (ref 0–44)
AST: 21 U/L (ref 15–41)
Albumin: 3 g/dL — ABNORMAL LOW (ref 3.5–5.0)
Alkaline Phosphatase: 67 U/L (ref 38–126)
Anion gap: 10 (ref 5–15)
BUN: 7 mg/dL (ref 6–20)
CO2: 24 mmol/L (ref 22–32)
Calcium: 9.5 mg/dL (ref 8.9–10.3)
Chloride: 102 mmol/L (ref 98–111)
Creatinine, Ser: 0.88 mg/dL (ref 0.44–1.00)
GFR calc Af Amer: >60 mL/min (ref >60)
GFR calc non Af Amer: >60 mL/min (ref >60)
Glucose, Bld: 86 mg/dL (ref 70–99)
Potassium: 3.7 mmol/L (ref 3.5–5.1)
Sodium: 136 mmol/L (ref 135–145)
Total Bilirubin: 0.4 mg/dL (ref 0.3–1.2)
Total Protein: 6.5 g/dL (ref 6.5–8.1)

## 2018-05-30 LAB — PROTEIN / CREATININE RATIO, URINE
Creatinine, Urine: 187.78 mg/dL
Protein Creatinine Ratio: 0.05 mg/mg{Cre} (ref 0.00–0.15)
Total Protein, Urine: 10 mg/dL

## 2018-05-30 LAB — TYPE AND SCREEN
ABO/RH(D): B POS
Antibody Screen: NEGATIVE

## 2018-05-30 MED ORDER — LACTATED RINGERS IV SOLN
INTRAVENOUS | Status: DC
  Administered 2018-05-30 – 2018-05-31 (×3): via INTRAVENOUS

## 2018-05-30 MED ORDER — DOCUSATE SODIUM 100 MG PO CAPS
100.0000 mg | ORAL_CAPSULE | Freq: Every day | ORAL | Status: DC
  Administered 2018-05-31 – 2018-06-01 (×2): 100 mg via ORAL
  Filled 2018-05-30 (×2): qty 1

## 2018-05-30 MED ORDER — PRENATAL MULTIVITAMIN CH
1.0000 | ORAL_TABLET | Freq: Every day | ORAL | Status: DC
  Administered 2018-05-31 – 2018-06-01 (×2): 1 via ORAL
  Filled 2018-05-30 (×2): qty 1

## 2018-05-30 MED ORDER — LABETALOL HCL 5 MG/ML IV SOLN
40.0000 mg | INTRAVENOUS | Status: DC | PRN
  Administered 2018-05-31: 40 mg via INTRAVENOUS

## 2018-05-30 MED ORDER — HYDRALAZINE HCL 20 MG/ML IJ SOLN: 10.0000 mg | INTRAMUSCULAR | Status: DC | PRN

## 2018-05-30 MED ORDER — HYDRALAZINE HCL 20 MG/ML IJ SOLN
5.0000 mg | INTRAMUSCULAR | Status: DC | PRN
  Administered 2018-05-30: 5 mg via INTRAVENOUS
  Administered 2018-05-30: 23:00:00 via INTRAVENOUS
  Filled 2018-05-30 (×2): qty 1

## 2018-05-30 MED ORDER — LABETALOL HCL 5 MG/ML IV SOLN
INTRAVENOUS | Status: AC
  Administered 2018-05-30: 20 mg via INTRAVENOUS
  Filled 2018-05-30: qty 4

## 2018-05-30 MED ORDER — BETAMETHASONE SOD PHOS & ACET 6 (3-3) MG/ML IJ SUSP
12.0000 mg | INTRAMUSCULAR | Status: AC
  Administered 2018-05-30 – 2018-05-31 (×2): 12 mg via INTRAMUSCULAR
  Filled 2018-05-30 (×2): qty 2

## 2018-05-30 MED ORDER — LABETALOL HCL 5 MG/ML IV SOLN: 80.0000 mg | INTRAVENOUS | Status: DC | PRN

## 2018-05-30 MED ORDER — ACETAMINOPHEN 325 MG PO TABS
650.0000 mg | ORAL_TABLET | ORAL | Status: DC | PRN
  Administered 2018-05-31: 650 mg via ORAL
  Filled 2018-05-30: qty 2

## 2018-05-30 MED ORDER — LABETALOL HCL 200 MG PO TABS: 800.0000 mg | ORAL_TABLET | Freq: Three times a day (TID) | ORAL | 3 refills | Status: DC

## 2018-05-30 MED ORDER — ZOLPIDEM TARTRATE 5 MG PO TABS
5.0000 mg | ORAL_TABLET | Freq: Every evening | ORAL | Status: DC | PRN
  Administered 2018-05-30 – 2018-05-31 (×2): 5 mg via ORAL
  Filled 2018-05-30 (×2): qty 1

## 2018-05-30 MED ORDER — LABETALOL HCL 5 MG/ML IV SOLN
80.0000 mg | INTRAVENOUS | Status: DC | PRN
  Administered 2018-05-30: 80 mg via INTRAVENOUS
  Filled 2018-05-30: qty 16

## 2018-05-30 MED ORDER — LABETALOL HCL 5 MG/ML IV SOLN
20.0000 mg | INTRAVENOUS | Status: DC | PRN
  Administered 2018-05-30 – 2018-05-31 (×2): 20 mg via INTRAVENOUS
  Filled 2018-05-30: qty 4

## 2018-05-30 MED ORDER — LABETALOL HCL 5 MG/ML IV SOLN: 40.0000 mg | INTRAVENOUS | Status: DC | PRN

## 2018-05-30 MED ORDER — HYDRALAZINE HCL 20 MG/ML IJ SOLN
10.0000 mg | INTRAMUSCULAR | Status: DC | PRN
  Administered 2018-05-30 (×2): 10 mg via INTRAVENOUS
  Filled 2018-05-30 (×2): qty 1

## 2018-05-30 MED ORDER — LABETALOL HCL 5 MG/ML IV SOLN
40.0000 mg | INTRAVENOUS | Status: DC | PRN
  Administered 2018-05-30: 40 mg via INTRAVENOUS
  Filled 2018-05-30 (×2): qty 8

## 2018-05-30 MED ORDER — LABETALOL HCL 5 MG/ML IV SOLN: 20.0000 mg | INTRAVENOUS | Status: DC | PRN

## 2018-05-30 MED ORDER — NIFEDIPINE 10 MG PO CAPS: 20.0000 mg | ORAL_CAPSULE | ORAL | Status: DC | PRN

## 2018-05-30 MED ORDER — MAGNESIUM SULFATE 40 G IN LACTATED RINGERS - SIMPLE
2.0000 g/h | INTRAVENOUS | Status: AC
  Administered 2018-05-31 (×2): 2 g/h via INTRAVENOUS
  Filled 2018-05-30 (×2): qty 500

## 2018-05-30 MED ORDER — NIFEDIPINE 10 MG PO CAPS
10.0000 mg | ORAL_CAPSULE | ORAL | Status: DC | PRN
  Filled 2018-05-30: qty 1

## 2018-05-30 MED ORDER — MAGNESIUM SULFATE BOLUS VIA INFUSION
4.0000 g | Freq: Once | INTRAVENOUS | Status: AC
  Administered 2018-05-30: 4 g via INTRAVENOUS
  Filled 2018-05-30: qty 500

## 2018-05-30 MED ORDER — CALCIUM CARBONATE ANTACID 500 MG PO CHEW: 2.0000 | CHEWABLE_TABLET | ORAL | Status: DC | PRN

## 2018-05-30 NOTE — Patient Instructions (Signed)
Return to office for any scheduled appointments. Call the office or go to the [facility] at [facility] [place] if:  You begin to have strong, frequent contractions  Your water breaks.  Sometimes it is a big gush of fluid, sometimes it is just a trickle that keeps getting your panties wet or running down your legs  You have vaginal bleeding.  It is normal to have a small amount of spotting if your cervix was checked.   You do not feel your baby moving like normal.  If you do not, get something to eat and drink and lay down and focus on feeling your baby move.   If your baby is still not moving like normal, you should call the office or go to MAU.  Any other obstetric concerns.   

## 2018-05-30 NOTE — MAU Note (Signed)
Pt presents to MAU from OB office due to elevated blood pressures and for further fetal monitoring. Pt denies headache, visual changes and VB/LOF. +FM

## 2018-05-30 NOTE — Progress Notes (Signed)
Addendum  Dr. Grace Bushy informed me that patient's scan showed that fetus has AEDF, also BP there was 183/101. She will be sent to MAU. Dr Grace Bushy will call Second Attending on call to give report.   Jaynie Collins, MD, FACOG Obstetrician & Gynecologist, [facility] for [employer], [facility]

## 2018-05-30 NOTE — Telephone Encounter (Signed)
Spoke with pt while her for her OB appt. Pt is undecided on feeding method at this time. She did not BF her 38 yo.   Reviewed Benefits of BF for mom and infant and gave handout. Pt reports she did not have any questions/concerns at this time.

## 2018-05-30 NOTE — MAU Provider Note (Signed)
History     CSN: [id number]  Arrival date and time: 05/30/18 1414   First Provider Initiated Contact with Patient 05/30/18 1510     Chief Complaint  Patient presents with  . Hypertension   HPI Veronica Adams is a 38 y.o. [id number] at [redacted]w[redacted]d who presents to MAU from MFM for evaluation of Chronic Hypertension and new diagnosis of fetal absent end diastolic flow. She denies headache, visual disturbance, RUQ/upper abdominal pain and new onset swelling. She endorses normal fetal movement, denies abdominal contraction pain, vaginal bleeding, abnormal vaginal discharge, fever or recent illness.  Patient's pregnancy includes diagnosis of Chronic Hypertension. She previously managed this with Labetalol 600 BID. Her dosage was increased to 800 mg TID at her appointment this morning. Patient reports she tool 600 mg at about 1330 hours today.  OB History    Gravida  7   Para  1   Term  1   Preterm      AB  5   Living  1     SAB      TAB  4   Ectopic  1   Multiple      Live Births  1           Past Medical History:  Diagnosis Date  . Asthma    exacerbation on 01/31/15  . Breast abscess   . Bronchitis   . Gestational hypertension   . History of prior pregnancy with IUGR newborn   . Hypertension   . Obesity   . Preeclampsia     Past Surgical History:  Procedure Laterality Date  . CESAREAN SECTION    . LAPAROSCOPY FOR ECTOPIC PREGNANCY      Family History  Problem Relation Age of Onset  . Diabetes Mother   . Hypertension Paternal Grandmother   . Heart disease Paternal Grandmother   . Stroke Father   . Breast cancer Paternal Aunt   . Breast cancer Paternal Aunt     Social History   Tobacco Use  . Smoking status: Former Smoker    Packs/day: 0.25    Types: Cigarettes  . Smokeless tobacco: Never Used  . Tobacco comment: 4 a day  Substance Use Topics  . Alcohol use: Not Currently    Comment: socially   . Drug use: Not Currently    Types: Marijuana     Allergies: No Known Allergies  Medications Prior to Admission  Medication Sig Dispense Refill Last Dose  . acetaminophen (TYLENOL) 500 MG tablet Take 1,000 mg by mouth every 6 (six) hours as needed for mild pain.   Taking  . aspirin EC 81 MG tablet Take 81 mg by mouth daily.   Taking  . dicyclomine (BENTYL) 20 MG tablet Take 1 tablet (20 mg total) by mouth 2 (two) times daily as needed (Abdominal cramping). (Patient not taking: Reported on 05/02/2018) 20 tablet 0 Not Taking  . labetalol (NORMODYNE) 200 MG tablet Take 4 tablets (800 mg total) by mouth 3 (three) times daily. 360 tablet 3 Taking  . prenatal vitamin w/FE, FA (PRENATAL 1 + 1) 27-1 MG TABS tablet Take 1 tablet by mouth daily at 12 noon.   Taking  . PROAIR HFA 108 (90 Base) MCG/ACT inhaler Inhale 2 puffs into the lungs every 4 (four) hours as needed.   Taking    Review of Systems  Constitutional: Negative for chills, fatigue and fever.  Eyes: Negative for visual disturbance.  Respiratory: Negative for chest tightness and shortness of breath.  Cardiovascular: Negative for chest pain.  Gastrointestinal: Negative for abdominal pain.  Genitourinary: Negative for vaginal bleeding, vaginal discharge and vaginal pain.  Neurological: Negative for dizziness, syncope, weakness and headaches.  All other systems reviewed and are negative.  Physical Exam   Blood pressure 134/82, pulse 76, temperature 98.1 F (36.7 C), temperature source Oral, resp. rate 18, height 5\' 5"  (1.651 m), weight (!) 139.3 kg, last menstrual period 10/31/2017, SpO2 100 %.  Physical Exam  Nursing note and vitals reviewed. Constitutional: She is oriented to person, place, and time. She appears well-developed and well-nourished.  Cardiovascular: Normal rate.  Respiratory: Effort normal and breath sounds normal.  GI: She exhibits no distension. There is no abdominal tenderness. There is no rebound.  Abdominal exam limited by maternal habitus  Genitourinary:     Genitourinary Comments: Not evaluated   Neurological: She is alert and oriented to person, place, and time.  Skin: Skin is warm and dry.  Psychiatric: She has a normal mood and affect. Her behavior is normal. Judgment and thought content normal.    MAU Course/MDM  Procedures  --Baseline 135, moderate variability, 10 x 10 accel achieved with vibroacoustic stim, decels noted at 1700 and 1708 --Toco: quiet --Severe BPs responding well to Labetalol x 3 --HPI, assessment and results, plan of care discussed with Dr. Despina HiddenEure. Magnesium Sulfate not indicated at this time per Dr.Eure  Patient Vitals for the past 24 hrs:  BP Temp Temp src Pulse Resp SpO2 Height Weight  05/30/18 1715 132/73 - - 75 - 96 % - -  05/30/18 1710 139/82 - - 75 - 95 % - -  05/30/18 1700 (!) 157/102 - - 73 - 94 % - -  05/30/18 1650 (!) 155/102 - - 75 - 94 % - -  05/30/18 1640 (!) 163/103 - - 73 - 95 % - -  05/30/18 1630 (!) 179/101 - - 68 - 99 % - -  05/30/18 1626 (!) 182/113 - - 67 - - - -  05/30/18 1615 (!) 180/112 - - 69 - 98 % - -  05/30/18 1601 (!) 179/106 - - 68 - - - -  05/30/18 1557 (!) 181/111 - - 66 - - - -  05/30/18 1551 (!) 184/110 - - 66 - 99 % - -  05/30/18 1547 (!) 209/128 - - 75 - - - -  05/30/18 1503 134/82 - - 76 - - - -  05/30/18 1437 (!) 168/110 98.1 F (36.7 C) Oral 78 18 100 % - -  05/30/18 1434 - - - - - - 5\' 5"  (1.651 m) (!) 139.3 kg     Assessment and Plan  --38 y.o. [id number] at 6946w1d  --Category II tracing --Chronic Hypertension with severe range blood pressures --New diagnosis of fetal absent end diastolic flow --Per Dr. Despina HiddenEure, [facility], Betamethasone ordered --Admission approved by Dr. Algernon Huxleyattray, Neonatologist  Calvert CantorSamantha C Sosha Shepherd, CNM 05/30/2018, 5:48 PM

## 2018-05-30 NOTE — Progress Notes (Signed)
PRENATAL VISIT NOTE  Subjective:  Veronica Adams is a 38 y.o. G7P1051 at 6450w1d being seen today for ongoing prenatal care.  She is currently monitored for the following issues for this high-risk pregnancy and has Chronic hypertension affecting pregnancy; Left breast abscess; Supervision of high risk pregnancy, antepartum; Previous cesarean delivery affecting pregnancy, antepartum; Tobacco use affecting pregnancy, antepartum; AMA (advanced maternal age) multigravida 35+; Obesity in pregnancy; BMI 40.0-44.9, adult (HCC); and History of intrauterine growth restriction in prior pregnancy, currently pregnant on their problem list.  Patient reports no complaints. Patient denies any headaches, visual symptoms, RUQ/epigastric pain or other concerning symptoms.  Contractions: Not present. Vag. Bleeding: None.  Movement: Present. Denies leaking of fluid.   The following portions of the patient's history were reviewed and updated as appropriate: allergies, current medications, past family history, past medical history, past social history, past surgical history and problem list.   Objective:   Vitals:   05/30/18 1011  BP: (!) 164/84  Pulse: 66  Temp: 98.6 F (37 C)  Weight: (!) 306 lb (138.8 kg)    Fetal Status: Fetal Heart Rate (bpm): 137   Movement: Present     General:  Alert, oriented and cooperative. Patient is in no acute distress.  Skin: Skin is warm and dry. No rash noted.   Cardiovascular: Normal heart rate noted  Respiratory: Normal respiratory effort, no problems with respiration noted  Abdomen: Soft, gravid, appropriate for gestational age.  Pain/Pressure: Absent     Pelvic: Cervical exam deferred        Extremities: Normal range of motion.  Edema: Trace  Mental Status: Normal mood and affect. Normal behavior. Normal judgment and thought content.   Assessment and Plan:  Pregnancy: [id number] at 8050w1d 1. Pre-existing essential hypertension during pregnancy, antepartum BP is  still elevated, no symptoms. Labs checked.  Will max out Labetalol for now, may need to add different agent. Has scan with MFM today, will follow up results and manage accordingly. Return for BP check next week. Antenatal testing to start at 32 weeks.  Strict preeclampsia precautions reviewed.  - US MFM FETAL BPP WO NON STRESS; Future - Comprehensive metabolic panel - Protein / creatinine ratio, urine - labetalol (NORMODYNE) 200 MG tablet; Take 4 tablets (800 mg total) by mouth 3 (three) times daily.  Dispense: 360 tablet; Refill: 3  3. Multigravida of advanced maternal age in third trimester  4. Previous cesarean delivery affecting pregnancy, antepartum Counseled regarding TOLAC vs RCS; risks/benefits discussed in detail. All questions answered.  Patient elects for TOLAC, consent signed 05/30/2018.  Also signed BTL consent today.  5. Supervision of high risk pregnancy, antepartum Preterm labor symptoms and general obstetric precautions including but not limited to vaginal bleeding, contractions, leaking of fluid and fetal movement were reviewed in detail with the patient. Please refer to After Visit Summary for other counseling recommendations.   Return in about 2 weeks (around 06/13/2018) [facility]  Future Appointments  Date Time Provider Department Center  06/01/2018  3:00 PM WH-MFC NURSE WH-MFC MFC-US  06/01/2018  3:00 PM WH-MFC US 3 WH-MFCUS MFC-US  06/04/2018  1:45 PM WH-MFC NURSE WH-MFC MFC-US  06/04/2018  1:45 PM WH-MFC US 2 WH-MFCUS MFC-US  06/06/2018  1:30 PM WH-MFC NURSE WH-MFC MFC-US  06/06/2018  1:30 PM WH-MFC US 1 WH-MFCUS MFC-US  06/08/2018  1:45 PM WH-MFC NURSE WH-MFC MFC-US  06/08/2018  1:45 PM WH-MFC US 2 WH-MFCUS MFC-US  06/14/2018  3:35 PM Alysia PennaErvin, Marolyn HammockMichael L, MD  WOC-WOCA WOC    Jaynie Collins, MD

## 2018-05-31 DIAGNOSIS — Z3A30 30 weeks gestation of pregnancy: Secondary | ICD-10-CM

## 2018-05-31 DIAGNOSIS — O10913 Unspecified pre-existing hypertension complicating pregnancy, third trimester: Secondary | ICD-10-CM

## 2018-05-31 LAB — COMPREHENSIVE METABOLIC PANEL
ALT: 15 IU/L (ref 0–32)
AST: 22 IU/L (ref 0–40)
Albumin/Globulin Ratio: 1.2 (ref 1.2–2.2)
Albumin: 3.6 g/dL — ABNORMAL LOW (ref 3.8–4.8)
Alkaline Phosphatase: 79 IU/L (ref 39–117)
BUN/Creatinine Ratio: 13 (ref 9–23)
BUN: 9 mg/dL (ref 6–20)
Bilirubin Total: <0.2 mg/dL (ref 0.0–1.2)
CO2: 19 mmol/L — ABNORMAL LOW (ref 20–29)
Calcium: 9.5 mg/dL (ref 8.7–10.2)
Chloride: 103 mmol/L (ref 96–106)
Creatinine, Ser: 0.69 mg/dL (ref 0.57–1.00)
GFR calc Af Amer: 128 mL/min/{1.73_m2} (ref >59)
GFR calc non Af Amer: 111 mL/min/{1.73_m2} (ref >59)
Globulin, Total: 3 g/dL (ref 1.5–4.5)
Glucose: 72 mg/dL (ref 65–99)
Potassium: 4.4 mmol/L (ref 3.5–5.2)
Sodium: 137 mmol/L (ref 134–144)
Total Protein: 6.6 g/dL (ref 6.0–8.5)

## 2018-05-31 LAB — PROTEIN / CREATININE RATIO, URINE
Creatinine, Urine: 75.1 mg/dL
Protein, Ur: 7.5 mg/dL
Protein/Creat Ratio: 100 mg/g creat (ref 0–200)

## 2018-05-31 LAB — SARS CORONAVIRUS 2 BY RT PCR (HOSPITAL ORDER, PERFORMED IN ~~LOC~~ HOSPITAL LAB): SARS Coronavirus 2: NEGATIVE

## 2018-05-31 LAB — GLUCOSE TOLERANCE, 1 HOUR: Glucose, 1Hr PP: 78 mg/dL (ref 65–199)

## 2018-05-31 LAB — MAGNESIUM: Magnesium: 3.9 mg/dL — ABNORMAL HIGH (ref 1.7–2.4)

## 2018-05-31 MED ORDER — LABETALOL HCL 200 MG PO TABS
800.0000 mg | ORAL_TABLET | Freq: Three times a day (TID) | ORAL | Status: DC
  Administered 2018-05-31 – 2018-06-01 (×4): 800 mg via ORAL
  Filled 2018-05-31 (×4): qty 4

## 2018-05-31 NOTE — Progress Notes (Signed)
Dr. Earlene Plater updated on BP of 164/84 at 1321 and discussed interventions performed.  Received verbal order not to treat right now; was told to let the PO labetalol have time to kick in. Will continue to monitor.

## 2018-05-31 NOTE — H&P (Signed)
Attestation signed by Lazaro Arms, MD at 05/31/2018 4:43 AM  Attestation of Attending Supervision of Advanced Practitioner (CNM/NP/PA): Evaluation and management procedures were performed by the Advanced Practitioner under my supervision and collaboration. I have reviewed the Advanced Practitioner's note and chart, and I agree with the management and plan.  Rockne Coons MD Attending Physician for the Center for [facility] 05/31/2018 4:43 AM        Show:Clear all Manual[x] Template[] Copied  Added by: Calvert Cantor, CNM  Hover for details  History   CSN: [id number]  Arrival date and time: 05/30/18 1414   First Provider Initiated Contact with Patient 05/30/18 1510        Chief Complaint  Patient presents with  . Hypertension   HPI Veronica Adams is a 38 y.o. [id number] at [redacted]w[redacted]d who presents to [facility]U from [facility] for evaluation of Chronic Hypertension and new diagnosis of fetal absent end diastolic flow. She denies headache, visual disturbance, RUQ/upper abdominal pain and new onset swelling. She endorses normal fetal movement, denies abdominal contraction pain, vaginal bleeding, abnormal vaginal discharge, fever or recent illness.  Patient's pregnancy includes diagnosis of Chronic Hypertension. She previously managed this with Labetalol 600 BID. Her dosage was increased to 800 mg TID at her appointment this morning. Patient reports she tool 600 mg at about 1330 hours today.          OB History    Gravida  7   Para  1   Term  1   Preterm      AB  5   Living  1     SAB      TAB  4   Ectopic  1   Multiple      Live Births  1           Past Medical History:  Diagnosis Date  . Asthma    exacerbation on 01/31/15  . Breast abscess   . Bronchitis   . Gestational hypertension   . History of prior pregnancy with IUGR newborn   . Hypertension   . Obesity   . Preeclampsia          Past Surgical History:   Procedure Laterality Date  . CESAREAN SECTION    . LAPAROSCOPY FOR ECTOPIC PREGNANCY           Family History  Problem Relation Age of Onset  . Diabetes Mother   . Hypertension Paternal Grandmother   . Heart disease Paternal Grandmother   . Stroke Father   . Breast cancer Paternal Aunt   . Breast cancer Paternal Aunt     Social History        Tobacco Use  . Smoking status: Former Smoker    Packs/day: 0.25    Types: Cigarettes  . Smokeless tobacco: Never Used  . Tobacco comment: 4 a day  Substance Use Topics  . Alcohol use: Not Currently    Comment: socially   . Drug use: Not Currently    Types: Marijuana    Allergies: No Known Allergies         Medications Prior to Admission  Medication Sig Dispense Refill Last Dose  . acetaminophen (TYLENOL) 500 MG tablet Take 1,000 mg by mouth every 6 (six) hours as needed for mild pain.   Taking  . aspirin EC 81 MG tablet Take 81 mg by mouth daily.   Taking  . dicyclomine (BENTYL) 20 MG tablet Take 1 tablet (20 mg total)  by mouth 2 (two) times daily as needed (Abdominal cramping). (Patient not taking: Reported on 05/02/2018) 20 tablet 0 Not Taking  . labetalol (NORMODYNE) 200 MG tablet Take 4 tablets (800 mg total) by mouth 3 (three) times daily. 360 tablet 3 Taking  . prenatal vitamin w/FE, FA (PRENATAL 1 + 1) 27-1 MG TABS tablet Take 1 tablet by mouth daily at 12 noon.   Taking  . PROAIR HFA 108 (90 Base) MCG/ACT inhaler Inhale 2 puffs into the lungs every 4 (four) hours as needed.   Taking    Review of Systems  Constitutional: Negative for chills, fatigue and fever.  Eyes: Negative for visual disturbance.  Respiratory: Negative for chest tightness and shortness of breath.   Cardiovascular: Negative for chest pain.  Gastrointestinal: Negative for abdominal pain.  Genitourinary: Negative for vaginal bleeding, vaginal discharge and vaginal pain.  Neurological: Negative for dizziness, syncope,  weakness and headaches.  All other systems reviewed and are negative.  Physical Exam   Blood pressure 134/82, pulse 76, temperature 98.1 F (36.7 C), temperature source Oral, resp. rate 18, height 5\' 5"  (1.651 m), weight (!) 139.3 kg, last menstrual period 10/31/2017, SpO2 100 %.  Physical Exam  Nursing note and vitals reviewed. Constitutional: She is oriented to person, place, and time. She appears well-developed and well-nourished.  Cardiovascular: Normal rate.  Respiratory: Effort normal and breath sounds normal.  GI: She exhibits no distension. There is no abdominal tenderness. There is no rebound.  Abdominal exam limited by maternal habitus  Genitourinary:    Genitourinary Comments: Not evaluated   Neurological: She is alert and oriented to person, place, and time.  Skin: Skin is warm and dry.  Psychiatric: She has a normal mood and affect. Her behavior is normal. Judgment and thought content normal.    MAU Course/MDM  Procedures  --Baseline 135, moderate variability, 10 x 10 accel achieved with vibroacoustic stim, decels noted at 1700 and 1708 --Toco: quiet --Severe BPs responding well to Labetalol x 3 --HPI, assessment and results, plan of care discussed with Dr. Despina Hidden. Magnesium Sulfate not indicated at this time per Dr.  Patient Vitals for the past 24 hrs:  BP Temp Temp src Pulse Resp SpO2 Height Weight  05/30/18 1715 132/73 - - 75 - 96 % - -  05/30/18 1710 139/82 - - 75 - 95 % - -  05/30/18 1700 (!) 157/102 - - 73 - 94 % - -  05/30/18 1650 (!) 155/102 - - 75 - 94 % - -  05/30/18 1640 (!) 163/103 - - 73 - 95 % - -  05/30/18 1630 (!) 179/101 - - 68 - 99 % - -  05/30/18 1626 (!) 182/113 - - 67 - - - -  05/30/18 1615 (!) 180/112 - - 69 - 98 % - -  05/30/18 1601 (!) 179/106 - - 68 - - - -  05/30/18 1557 (!) 181/111 - - 66 - - - -  05/30/18 1551 (!) 184/110 - - 66 - 99 % - -  05/30/18 1547 (!) 209/128 - - 75 - - - -  05/30/18 1503 134/82 - - 76 - - - -   05/30/18 1437 (!) 168/110 98.1 F (36.7 C) Oral 78 18 100 % - -  05/30/18 1434 - - - - - - 5\' 5"  (1.651 m) (!) 139.3 kg     Assessment and Plan  --38 y.o. [id number] at 2567w1d  --Category II tracing --Chronic Hypertension with severe range blood pressures --New  diagnosis of fetal absent end diastolic flow --Per Dr. Despina Hidden, admit to [facility], Betamethasone ordered --Admission approved by Dr. Algernon Huxley, Neonatologist  Calvert Cantor, CNM 05/30/2018, 5:48 PM         Cosigned by: Lazaro Arms, MD at 05/31/2018 4:43 AM  Revision History

## 2018-05-31 NOTE — Progress Notes (Signed)
Patient ID: Veronica Adams, female   DOB: [birth year], 38 y.o.   MRN: [id number] FACULTY PRACTICE ANTEPARTUM(COMPREHENSIVE) NOTE  Veronica Adams is a 38 y.o. [id number] with Estimated Date of Delivery: 08/07/18   By   [redacted]w[redacted]d  who is admitted for management of hypertension and fetal absent end diastolic flow.    Fetal presentation is unsure. Length of Stay:  1  Days  Date of admission:05/30/2018  Subjective: No CNS complaints Patient reports the fetal movement as active. Patient reports uterine contraction  activity as none. Patient reports  vaginal bleeding as none. Patient describes fluid per vagina as None.  Vitals:  Blood pressure (!) 155/93, pulse 86, temperature 98.2 F (36.8 C), temperature source Oral, resp. rate 16, height 5\' 5"  (1.651 m), weight (!) 139.3 kg, last menstrual period 10/31/2017, SpO2 100 %. Vitals:   05/31/18 0538 05/31/18 0601 05/31/18 0701 05/31/18 0803  BP:  (!) 154/86 (!) 153/71 (!) 155/93  Pulse:  79 85 86  Resp:  16  16  Temp: 98.2 F (36.8 C)     TempSrc: Oral     SpO2:    100%  Weight:      Height:       Physical Examination:  General appearance - alert, well appearing, and in no distress Abdomen - soft, nontender, nondistended, no masses or organomegaly Fundal Height:  size equals dates Pelvic Exam:  examination not indicated Cervical Exam: Not evaluated.  Extremities: extremities normal, atraumatic, no cyanosis or edema with DTRs 2+ bilaterally Membranes:intact  Fetal Monitoring:  Baseline: 140 bpm, Variability: Fair (1-6 bpm), Accelerations: Non-reactive but appropriate for gestational age and Decelerations: Absent   On magnesium sulfate  Labs:  Results for orders placed or performed during the hospital encounter of 05/30/18 (from the past 24 hour(s))  Protein / creatinine ratio, urine   Collection Time: 05/30/18  2:36 PM  Result Value Ref Range   Creatinine, Urine 187.78 mg/dL   Total Protein, Urine 10 mg/dL   Protein Creatinine Ratio  0.05 0.00 - 0.15 mg/mg[Cre]  Urinalysis, Routine w reflex microscopic   Collection Time: 05/30/18  2:36 PM  Result Value Ref Range   Color, Urine YELLOW YELLOW   APPearance CLEAR CLEAR   Specific Gravity, Urine 1.019 1.005 - 1.030   pH 5.0 5.0 - 8.0   Glucose, UA NEGATIVE NEGATIVE mg/dL   Hgb urine dipstick NEGATIVE NEGATIVE   Bilirubin Urine NEGATIVE NEGATIVE   Ketones, ur 5 (A) NEGATIVE mg/dL   Protein, ur NEGATIVE NEGATIVE mg/dL   Nitrite NEGATIVE NEGATIVE   Leukocytes,Ua NEGATIVE NEGATIVE  CBC with Differential/Platelet   Collection Time: 05/30/18  3:17 PM  Result Value Ref Range   WBC 6.9 4.0 - 10.5 K/uL   RBC 4.06 3.87 - 5.11 MIL/uL   Hemoglobin 12.5 12.0 - 15.0 g/dL   HCT 11.0 21.1 - 17.3 %   MCV 91.1 80.0 - 100.0 fL   MCH 30.8 26.0 - 34.0 pg   MCHC 33.8 30.0 - 36.0 g/dL   RDW 56.7 01.4 - 10.3 %   Platelets 216 150 - 400 K/uL   nRBC 0.0 0.0 - 0.2 %   Neutrophils Relative % 57 %   Neutro Abs 4.0 1.7 - 7.7 K/uL   Lymphocytes Relative 32 %   Lymphs Abs 2.2 0.7 - 4.0 K/uL   Monocytes Relative 7 %   Monocytes Absolute 0.5 0.1 - 1.0 K/uL   Eosinophils Relative 4 %   Eosinophils Absolute 0.3 0.0 - 0.5 K/uL  Basophils Relative 0 %   Basophils Absolute 0.0 0.0 - 0.1 K/uL   Immature Granulocytes 0 %   Abs Immature Granulocytes 0.02 0.00 - 0.07 K/uL  Comprehensive metabolic panel   Collection Time: 05/30/18  3:17 PM  Result Value Ref Range   Sodium 136 135 - 145 mmol/L   Potassium 3.7 3.5 - 5.1 mmol/L   Chloride 102 98 - 111 mmol/L   CO2 24 22 - 32 mmol/L   Glucose, Bld 86 70 - 99 mg/dL   BUN 7 6 - 20 mg/dL   Creatinine, Ser 4.09 0.44 - 1.00 mg/dL   Calcium 9.5 8.9 - 81.1 mg/dL   Total Protein 6.5 6.5 - 8.1 g/dL   Albumin 3.0 (L) 3.5 - 5.0 g/dL   AST 21 15 - 41 U/L   ALT 17 0 - 44 U/L   Alkaline Phosphatase 67 38 - 126 U/L   Total Bilirubin 0.4 0.3 - 1.2 mg/dL   GFR calc non Af Amer >60 >60 mL/min   GFR calc Af Amer >60 >60 mL/min   Anion gap 10 5 - 15  Type  and screen [facility]   Collection Time: 05/30/18  6:23 PM  Result Value Ref Range   ABO/RH(D) B POS    Antibody Screen NEG    Sample Expiration      06/02/2018,2359 Performed at [facility], [address], [place], [place] [address]   Results for orders placed or performed in visit on 05/30/18 (from the past 24 hour(s))  Comprehensive metabolic panel   Collection Time: 05/30/18 11:27 AM  Result Value Ref Range   Glucose 72 65 - 99 mg/dL   BUN 9 6 - 20 mg/dL   Creatinine, Ser 2.95 0.57 - 1.00 mg/dL   GFR calc non Af Amer 111 >59 mL/min/1.73   GFR calc Af Amer 128 >59 mL/min/1.73   BUN/Creatinine Ratio 13 9 - 23   Sodium 137 134 - 144 mmol/L   Potassium 4.4 3.5 - 5.2 mmol/L   Chloride 103 96 - 106 mmol/L   CO2 19 (L) 20 - 29 mmol/L   Calcium 9.5 8.7 - 10.2 mg/dL   Total Protein 6.6 6.0 - 8.5 g/dL   Albumin 3.6 (L) 3.8 - 4.8 g/dL   Globulin, Total 3.0 1.5 - 4.5 g/dL   Albumin/Globulin Ratio 1.2 1.2 - 2.2   Bilirubin Total <0.2 0.0 - 1.2 mg/dL   Alkaline Phosphatase 79 39 - 117 IU/L   AST 22 0 - 40 IU/L   ALT 15 0 - 32 IU/L  Results for orders placed or performed in visit on 05/30/18 (from the past 24 hour(s))  Glucose Tolerance, 1 Hour   Collection Time: 05/30/18  9:55 AM  Result Value Ref Range   Glucose, 1Hr PP 78 65 - 199 mg/dL    Imaging Studies:      Medications:  Scheduled . betamethasone acetate-betamethasone sodium phosphate  12 mg Intramuscular Q24 Hr x 2  . docusate sodium  100 mg Oral Daily  . prenatal multivitamin  1 tablet Oral Q1200   I have reviewed the patient's current medications.  ASSESSMENT: [id number] [redacted]w[redacted]d Estimated Date of Delivery: 08/07/18  Absent end diastolic Doppler flow Chronic hypertension potentially with superimposed pre eclampsia, requiring multiple rounds of IV drug managment Patient Active Problem List   Diagnosis Date Noted  . Obesity in pregnancy 05/21/2018  . BMI 40.0-44.9, adult (HCC) 05/21/2018  .  History of intrauterine growth restriction in  prior pregnancy, currently pregnant 05/21/2018  . AMA (advanced maternal age) multigravida 35+ 04/12/2018  . Supervision of high risk pregnancy, antepartum 04/11/2018  . Previous cesarean delivery affecting pregnancy, antepartum 04/11/2018  . Tobacco use affecting pregnancy, antepartum 04/11/2018  . Chronic hypertension affecting pregnancy 01/15/2015  . Left breast abscess 01/15/2015    PLAN: >BMZ series is being given >MgSO4 prophylaxis for 24 hours >increased BP meds to labetalol 800 TID >Doppler scheduled for tomorrow  Amaryllis DykeLuther H  05/31/2018,9:19 AM

## 2018-06-01 ENCOUNTER — Inpatient Hospital Stay (HOSPITAL_COMMUNITY): Payer: Medicaid Other

## 2018-06-01 ENCOUNTER — Encounter (HOSPITAL_COMMUNITY): Payer: Self-pay

## 2018-06-01 ENCOUNTER — Ambulatory Visit (HOSPITAL_COMMUNITY): Payer: Medicaid Other

## 2018-06-01 DIAGNOSIS — Z3A30 30 weeks gestation of pregnancy: Secondary | ICD-10-CM

## 2018-06-01 DIAGNOSIS — O99333 Smoking (tobacco) complicating pregnancy, third trimester: Secondary | ICD-10-CM

## 2018-06-01 DIAGNOSIS — O34219 Maternal care for unspecified type scar from previous cesarean delivery: Secondary | ICD-10-CM

## 2018-06-01 DIAGNOSIS — O99213 Obesity complicating pregnancy, third trimester: Secondary | ICD-10-CM

## 2018-06-01 DIAGNOSIS — O10013 Pre-existing essential hypertension complicating pregnancy, third trimester: Secondary | ICD-10-CM

## 2018-06-01 DIAGNOSIS — O09293 Supervision of pregnancy with other poor reproductive or obstetric history, third trimester: Secondary | ICD-10-CM

## 2018-06-01 DIAGNOSIS — O283 Abnormal ultrasonic finding on antenatal screening of mother: Secondary | ICD-10-CM

## 2018-06-01 MED ORDER — NIFEDIPINE ER OSMOTIC RELEASE 30 MG PO TB24
30.0000 mg | ORAL_TABLET | Freq: Every day | ORAL | Status: DC
  Administered 2018-06-01: 14:00:00 30 mg via ORAL
  Filled 2018-06-01: qty 1

## 2018-06-01 MED ORDER — NIFEDIPINE ER OSMOTIC RELEASE 30 MG PO TB24: 30.0000 mg | ORAL_TABLET | Freq: Every day | ORAL | 2 refills | Status: DC

## 2018-06-01 NOTE — Progress Notes (Signed)
Pt left AMA. Pt reviewed and signed AMA paperwork. Rn encouraged patient to pick up and follow prescriptions.

## 2018-06-01 NOTE — Discharge Summary (Signed)
Antenatal Physician Discharge Summary   PATIENT LEFT AGAINST MEDICAL ADVICE  Patient ID: Veronica Adams MRN: [id number] DOB/AGE: Dec 07, 1980 38 y.o.  Admit date: 05/30/2018 Discharge date: 06/01/2018  Admission Diagnoses: fetal absent end diastolic flow, uncontrolled chronic hypertension  Discharge Diagnoses:  Active Problems:   Chronic hypertension affecting pregnancy   Previous cesarean delivery affecting pregnancy, antepartum   AMA (advanced maternal age) multigravida 35+   Obesity in pregnancy   BMI 40.0-44.9, adult (HCC)   History of intrauterine growth restriction in prior pregnancy, currently pregnant   Abnormal fetal ultrasound   Prenatal Procedures: NST and ultrasound  Consults: Neonatology, Maternal Fetal Medicine  Hospital Course:  This is a 38 y.o. [id number] with IUP at 5776w3d admitted for fetal absent end diastolic flow on ultrasound as well as uncontrolled chronic hypertension. She was given 2 doses of betamethasone and had ultrasound repeated today, per prelim report, BPP 8/8, no AEDF however final report not back. NST today minimal variability at first, then became moderate. No accels or decels.  Called to patient room by RN where I had a long discussion with patient and FOB. She is concerned that she is getting blood pressure medication based on inaccurate readings, I reviewed that even with correct BP cuff, her blood pressure remains significantly and dangerously elevated. She denies any complaints and is overall feeling well today. Reports the ultrasound "looked good", I reviewed the final read was not back yet but it is good news that they did not see anything overly concerning. I reviewed that ultrasound findings were reassuring, however her blood pressure remained dangerously high, and I started procardia in order to better control her blood pressure. Reviewed that because she remains in the severe range, I would not feel comfortable discharging her today. She  verbalized understanding but feels that she needs to be home to take care of her other children and that she can monitor her blood pressure at home as easily as we can here.   I reviewed the risks of severely elevated blood pressure including risks to herself such as stroke, MI and the risks to baby such as placental abruption and fetal death, among other things. Reviewed that she remains at elevated risk for these things when her blood pressure is in the severe range and that I cannot discharge her home without better controlling her blood pressure. I reviewed that while she is in the hospital, we can monitor her and take measures to intervene if she or fetus appears to be in danger but these measures cannot be done quickly as an outpatient and she is at high risk for fetal death if an adverse event occurs while she is at home. Patient verbalizes understanding of all of the above and expresses a desire to leave. She understands she is leaving AGAINST MEDICAL ADVICE.   I reviewed that she may return to the hospital at any time for care, that prescriptions will be sent to her pharmacy and she should continue to take her blood pressure medications as directed. I reviewed that she should continue to come for her prenatal care and to return to hospital for any significantly elevated blood pressure or any other new onset symptoms. Patient verbalizes understanding and states she will continue to come to appointments and check her blood pressure at home as directed. Reviewed all instructions, patient verbalizes understanding. I again reviewed the risks of her leaving the hospital AGAINST MEDICAL ADVICE including stroke and death for herself as well as fetal death, she verbalizes  understanding and desires to leave.   Patient signed AMA paperwork with RN and left the premises.  Discharge Exam: Temp:  [98.2 F (36.8 C)-98.4 F (36.9 C)] 98.4 F (36.9 C) (05/15 1217) Pulse Rate:  [77-90] 81 (05/15 1233) Resp:   [17-20] 18 (05/15 1217) BP: (136-167)/(80-98) 151/87 (05/15 1233) SpO2:  [93 %-100 %] 99 % (05/15 1217) Physical Examination: CONSTITUTIONAL: Well-developed, well-nourished female in no acute distress.  SKIN: Skin is warm and dry. No rash noted. Not diaphoretic. No erythema. No pallor. NEUROLGIC: Alert and oriented to person, place, and time. Normal reflexes, muscle tone coordination. No cranial nerve deficit noted. PSYCHIATRIC: Normal mood and affect. Normal behavior. Normal judgment and thought content. CARDIOVASCULAR: Normal heart rate noted RESPIRATORY: Effort normal, no problems with respiration noted MUSCULOSKELETAL: Normal range of motion. No edema and no tenderness. 2+ distal pulses. ABDOMEN: Soft, nontender, nondistended, gravid. CERVIX:  deferred  Fetal monitoring: FHR: 140 bpm, Variability: minimal to moderate, Accelerations: not present, Decelerations: Absent  Uterine activity: no contractions per hour  Significant Diagnostic Studies: ultrasound  Discharge Condition: fair  Disposition: home  PATIENT LEFT AGAINST MEDICAL ADVICE  Signed: Baldemar Lenis, M.D. Attending Center for [employer] (Faculty Practice)  06/01/2018, 3:47 PM

## 2018-06-01 NOTE — Progress Notes (Signed)
Patient ID: TYISHA KOSCO, female   DOB: 1980-09-20, 38 y.o.   MRN: [id number] FACULTY PRACTICE ANTEPARTUM(COMPREHENSIVE) NOTE  KINZLI TAUZIN is a 38 y.o. [id number] at [redacted]w[redacted]d by best clinical estimate who is admitted for HTN and AEDF on dopplers.   Fetal presentation is cephalic. Length of Stay:  2  Days  Subjective: Had question about potential extended hospitalization Patient reports the fetal movement as active. Patient reports uterine contraction  activity as none. Patient reports  vaginal bleeding as none. Patient describes fluid per vagina as None.  Vitals:  Blood pressure (!) 147/83, pulse 90, temperature 98.3 F (36.8 C), temperature source Oral, resp. rate 20, height 5\' 5"  (1.651 m), weight (!) 139.3 kg, last menstrual period 10/31/2017, SpO2 93 %. Physical Examination:  General appearance - alert, well appearing, and in no distress Heart - normal rate and regular rhythm Abdomen - soft, nontender, nondistended Fundal Height:  size equals dates Cervical Exam: Not evaluated. Extremities: extremities normal, atraumatic, no cyanosis or edema and Homans sign is negative, no sign of DVT  Membranes:intact  Fetal Monitoring:  Baseline: 135 bpm, Variability: Fair (1-6 bpm), Accelerations: Non-reactive but appropriate for gestational age and Decelerations: Absent  Labs:  Results for orders placed or performed during the hospital encounter of 05/30/18 (from the past 24 hour(s))  Magnesium   Collection Time: 05/31/18 11:44 AM  Result Value Ref Range   Magnesium 3.9 (H) 1.7 - 2.4 mg/dL  SARS Coronavirus 2 (CEPHEID - Performed in [facility] hospital lab), [facility] Order   Collection Time: 05/31/18  8:50 PM  Result Value Ref Range   SARS Coronavirus 2 NEGATIVE NEGATIVE     Medications:  Scheduled . docusate sodium  100 mg Oral Daily  . labetalol  800 mg Oral TID  . prenatal multivitamin  1 tablet Oral Q1200   I have reviewed the patient's current  medications.  ASSESSMENT: Patient Active Problem List   Diagnosis Date Noted  . Obesity in pregnancy 05/21/2018  . BMI 40.0-44.9, adult (HCC) 05/21/2018  . History of intrauterine growth restriction in prior pregnancy, currently pregnant 05/21/2018  . AMA (advanced maternal age) multigravida 35+ 04/12/2018  . Supervision of high risk pregnancy, antepartum 04/11/2018  . Previous cesarean delivery affecting pregnancy, antepartum 04/11/2018  . Tobacco use affecting pregnancy, antepartum 04/11/2018  . Chronic hypertension affecting pregnancy 01/15/2015  . Left breast abscess 01/15/2015    PLAN: Continue labetalol, US dopplers today  Scheryl Darter 06/01/2018,7:44 AM

## 2018-06-01 NOTE — Accreditation Note (Signed)
Pt left AMA  While I was at lunch.  pt had indicated she had child care issues  And had concerns about method of  Obtaining her b/p with automatic b/p cuff     Re took b/p per pt request

## 2018-06-04 ENCOUNTER — Ambulatory Visit (HOSPITAL_COMMUNITY): Payer: Medicaid Other

## 2018-06-04 ENCOUNTER — Ambulatory Visit (HOSPITAL_COMMUNITY): Admission: RE | Admit: 2018-06-04 | Payer: Medicaid Other | Source: Ambulatory Visit

## 2018-06-06 ENCOUNTER — Ambulatory Visit (HOSPITAL_COMMUNITY): Admission: RE | Admit: 2018-06-06 | Payer: Medicaid Other | Source: Ambulatory Visit

## 2018-06-06 ENCOUNTER — Ambulatory Visit (HOSPITAL_COMMUNITY): Payer: Medicaid Other

## 2018-06-08 ENCOUNTER — Inpatient Hospital Stay (HOSPITAL_BASED_OUTPATIENT_CLINIC_OR_DEPARTMENT_OTHER): Payer: Medicaid Other

## 2018-06-08 ENCOUNTER — Ambulatory Visit (HOSPITAL_COMMUNITY): Payer: Medicaid Other

## 2018-06-08 ENCOUNTER — Encounter (HOSPITAL_COMMUNITY): Payer: Self-pay | Admitting: *Deleted

## 2018-06-08 ENCOUNTER — Other Ambulatory Visit: Payer: Self-pay

## 2018-06-08 ENCOUNTER — Inpatient Hospital Stay (HOSPITAL_COMMUNITY)
Admission: AD | Admit: 2018-06-08 | Discharge: 2018-06-08 | Disposition: A | Discharge status: Home / Self Care | Payer: Medicaid Other | Attending: Family Medicine | Admitting: Family Medicine

## 2018-06-08 DIAGNOSIS — O288 Other abnormal findings on antenatal screening of mother: Secondary | ICD-10-CM

## 2018-06-08 DIAGNOSIS — O26893 Other specified pregnancy related conditions, third trimester: Secondary | ICD-10-CM | POA: Diagnosis not present

## 2018-06-08 DIAGNOSIS — Z3A31 31 weeks gestation of pregnancy: Secondary | ICD-10-CM | POA: Diagnosis not present

## 2018-06-08 DIAGNOSIS — M7918 Myalgia, other site: Secondary | ICD-10-CM | POA: Diagnosis not present

## 2018-06-08 DIAGNOSIS — R109 Unspecified abdominal pain: Secondary | ICD-10-CM | POA: Diagnosis present

## 2018-06-08 DIAGNOSIS — O09523 Supervision of elderly multigravida, third trimester: Secondary | ICD-10-CM | POA: Diagnosis not present

## 2018-06-08 DIAGNOSIS — O99333 Smoking (tobacco) complicating pregnancy, third trimester: Secondary | ICD-10-CM

## 2018-06-08 DIAGNOSIS — Z823 Family history of stroke: Secondary | ICD-10-CM | POA: Insufficient documentation

## 2018-06-08 DIAGNOSIS — O99213 Obesity complicating pregnancy, third trimester: Secondary | ICD-10-CM | POA: Diagnosis not present

## 2018-06-08 DIAGNOSIS — Z87891 Personal history of nicotine dependence: Secondary | ICD-10-CM | POA: Diagnosis not present

## 2018-06-08 DIAGNOSIS — O34219 Maternal care for unspecified type scar from previous cesarean delivery: Secondary | ICD-10-CM

## 2018-06-08 DIAGNOSIS — R319 Hematuria, unspecified: Secondary | ICD-10-CM | POA: Insufficient documentation

## 2018-06-08 DIAGNOSIS — O09293 Supervision of pregnancy with other poor reproductive or obstetric history, third trimester: Secondary | ICD-10-CM

## 2018-06-08 DIAGNOSIS — R0781 Pleurodynia: Secondary | ICD-10-CM | POA: Insufficient documentation

## 2018-06-08 DIAGNOSIS — O10013 Pre-existing essential hypertension complicating pregnancy, third trimester: Secondary | ICD-10-CM

## 2018-06-08 DIAGNOSIS — Z8249 Family history of ischemic heart disease and other diseases of the circulatory system: Secondary | ICD-10-CM | POA: Insufficient documentation

## 2018-06-08 LAB — URINALYSIS, ROUTINE W REFLEX MICROSCOPIC
Bilirubin Urine: NEGATIVE
Glucose, UA: NEGATIVE mg/dL
Hgb urine dipstick: NEGATIVE
Ketones, ur: 5 mg/dL — AB
Leukocytes,Ua: NEGATIVE
Nitrite: NEGATIVE
Protein, ur: NEGATIVE mg/dL
Specific Gravity, Urine: 1.027 (ref 1.005–1.030)
pH: 5 (ref 5.0–8.0)

## 2018-06-08 NOTE — MAU Note (Signed)
Was here last wk on OB Spec, something about the umbilical cord.  Today was in the kitchen.  Sneezed and had a sharp pain, below rt breast, it is still there- something popped.  Saw blood in her urine (in the toilet) after this, none when she wiped.

## 2018-06-08 NOTE — Discharge Instructions (Signed)

## 2018-06-08 NOTE — MAU Provider Note (Signed)
History     CSN: [id number]  Arrival date and time: 06/08/18 1656   First Provider Initiated Contact with Patient 06/08/18 1749      Chief Complaint  Patient presents with  . Abdominal Pain  . Hematuria   G7P1051 @31 .3 wks presenting with blood in urine. Reports sneezing earlier today and felt a sharp pain at her right rib cage. Shortly after this she went to the BR and saw blood in the toilet. She is unsure if the blood came from her urine or vagina but did not see any blood when she wiped. Denies dysuria. No LOF or ctx.    OB History    Gravida  7   Para  1   Term  1   Preterm      AB  5   Living  1     SAB      TAB  4   Ectopic  1   Multiple      Live Births  1           Past Medical History:  Diagnosis Date  . Asthma    exacerbation on 01/31/15  . Breast abscess   . Bronchitis   . Gestational hypertension   . History of prior pregnancy with IUGR newborn   . Hypertension   . Obesity   . Preeclampsia     Past Surgical History:  Procedure Laterality Date  . CESAREAN SECTION    . LAPAROSCOPY FOR ECTOPIC PREGNANCY      Family History  Problem Relation Age of Onset  . Diabetes Mother   . Hypertension Paternal Grandmother   . Heart disease Paternal Grandmother   . Stroke Father   . Breast cancer Paternal Aunt   . Breast cancer Paternal Aunt     Social History   Tobacco Use  . Smoking status: Former Smoker    Packs/day: 0.25    Types: Cigarettes  . Smokeless tobacco: Never Used  . Tobacco comment: 4 a day  Substance Use Topics  . Alcohol use: Not Currently    Comment: socially   . Drug use: Not Currently    Types: Marijuana    Allergies: No Known Allergies  No medications prior to admission.    Review of Systems  Gastrointestinal: Negative for abdominal pain.  Genitourinary: Positive for hematuria. Negative for dysuria, urgency, vaginal bleeding and vaginal discharge.   Physical Exam   Blood pressure (!) 144/88, pulse  76, temperature 98.4 F (36.9 C), temperature source Oral, resp. rate 18, last menstrual period 10/31/2017, SpO2 99 %.  Physical Exam  Constitutional: She is oriented to person, place, and time. She appears well-developed and well-nourished. No distress.  HENT:  Head: Normocephalic and atraumatic.  Neck: Normal range of motion.  Cardiovascular: Normal rate.  Respiratory: Effort normal. No respiratory distress.  GI: Soft. She exhibits no distension. There is no abdominal tenderness.  gravid  Genitourinary:    Genitourinary Comments: External: no lesions or erythema Vagina: rugated, pink, moist, thin white discharge, no blood Cervix closed/thick    Musculoskeletal: Normal range of motion.  Neurological: She is alert and oriented to person, place, and time.  Skin: Skin is warm and dry.  Psychiatric: She has a normal mood and affect.  EFM: 135 bpm, mod variability, no accels, no decels Toco: none  Results for orders placed or performed during the hospital encounter of 06/08/18 (from the past 24 hour(s))  Urinalysis, Routine w reflex microscopic     Status:  Abnormal   Collection Time: 06/08/18  5:53 PM  Result Value Ref Range   Color, Urine YELLOW YELLOW   APPearance HAZY (A) CLEAR   Specific Gravity, Urine 1.027 1.005 - 1.030   pH 5.0 5.0 - 8.0   Glucose, UA NEGATIVE NEGATIVE mg/dL   Hgb urine dipstick NEGATIVE NEGATIVE   Bilirubin Urine NEGATIVE NEGATIVE   Ketones, ur 5 (A) NEGATIVE mg/dL   Protein, ur NEGATIVE NEGATIVE mg/dL   Nitrite NEGATIVE NEGATIVE   Leukocytes,Ua NEGATIVE NEGATIVE    MAU Course  Procedures  MDM Labs ordered and reviewed. No evidence of VB or hematuria. Pain in rib cage likely MSK, dicussed treatment with Tylenol and heat. NST not reactive, BPP ordered> 8/8 with normal AFI. Stable for discharge home.   Assessment and Plan   1. [redacted] weeks gestation of pregnancy   2. Non-stress test nonreactive   3. Musculoskeletal pain    Discharge home Follow up  at [facility] as scheduled PTL precautions  Allergies as of 06/08/2018   No Known Allergies     Medication List    TAKE these medications   acetaminophen 500 MG tablet Commonly known as:  TYLENOL Take 1,000 mg by mouth every 6 (six) hours as needed for mild pain.   aspirin EC 81 MG tablet Take 81 mg by mouth daily.   labetalol 200 MG tablet Commonly known as:  NORMODYNE Take 4 tablets (800 mg total) by mouth 3 (three) times daily.   NIFEdipine 30 MG 24 hr tablet Commonly known as:  PROCARDIA-XL/NIFEDICAL-XL Take 1 tablet (30 mg total) by mouth daily.   prenatal vitamin w/FE, FA 27-1 MG Tabs tablet Take 1 tablet by mouth daily at 12 noon.      Donette LarryMelanie Belem Hintze, CNM 06/08/2018, 7:28 PM

## 2018-06-13 ENCOUNTER — Encounter (HOSPITAL_COMMUNITY): Payer: Self-pay

## 2018-06-13 ENCOUNTER — Ambulatory Visit (HOSPITAL_COMMUNITY): Payer: Medicaid Other | Admitting: *Deleted

## 2018-06-13 ENCOUNTER — Encounter: Payer: Self-pay | Admitting: *Deleted

## 2018-06-13 ENCOUNTER — Other Ambulatory Visit: Payer: Self-pay

## 2018-06-13 ENCOUNTER — Ambulatory Visit (HOSPITAL_COMMUNITY)
Admission: RE | Admit: 2018-06-13 | Discharge: 2018-06-13 | Disposition: A | Discharge status: Home / Self Care | Payer: Medicaid Other | Source: Ambulatory Visit | Attending: Maternal & Fetal Medicine | Admitting: Maternal & Fetal Medicine

## 2018-06-13 VITALS — BP 163/98 | HR 82 | Temp 98.6°F

## 2018-06-13 DIAGNOSIS — O0990 Supervision of high risk pregnancy, unspecified, unspecified trimester: Secondary | ICD-10-CM | POA: Diagnosis present

## 2018-06-13 DIAGNOSIS — O99213 Obesity complicating pregnancy, third trimester: Secondary | ICD-10-CM | POA: Diagnosis not present

## 2018-06-13 DIAGNOSIS — O09293 Supervision of pregnancy with other poor reproductive or obstetric history, third trimester: Secondary | ICD-10-CM | POA: Diagnosis not present

## 2018-06-13 DIAGNOSIS — O283 Abnormal ultrasonic finding on antenatal screening of mother: Secondary | ICD-10-CM

## 2018-06-13 DIAGNOSIS — O10013 Pre-existing essential hypertension complicating pregnancy, third trimester: Secondary | ICD-10-CM

## 2018-06-13 DIAGNOSIS — O99333 Smoking (tobacco) complicating pregnancy, third trimester: Secondary | ICD-10-CM

## 2018-06-13 DIAGNOSIS — O365990 Maternal care for other known or suspected poor fetal growth, unspecified trimester, not applicable or unspecified: Secondary | ICD-10-CM | POA: Diagnosis present

## 2018-06-13 DIAGNOSIS — O10919 Unspecified pre-existing hypertension complicating pregnancy, unspecified trimester: Secondary | ICD-10-CM | POA: Diagnosis present

## 2018-06-13 DIAGNOSIS — Z3A31 31 weeks gestation of pregnancy: Secondary | ICD-10-CM

## 2018-06-13 DIAGNOSIS — O09523 Supervision of elderly multigravida, third trimester: Secondary | ICD-10-CM

## 2018-06-13 DIAGNOSIS — O34219 Maternal care for unspecified type scar from previous cesarean delivery: Secondary | ICD-10-CM | POA: Diagnosis present

## 2018-06-14 ENCOUNTER — Encounter: Payer: Medicaid Other | Admitting: Obstetrics and Gynecology

## 2018-06-14 ENCOUNTER — Other Ambulatory Visit (HOSPITAL_COMMUNITY): Payer: Self-pay | Admitting: *Deleted

## 2018-06-14 DIAGNOSIS — O10919 Unspecified pre-existing hypertension complicating pregnancy, unspecified trimester: Secondary | ICD-10-CM

## 2018-06-15 ENCOUNTER — Encounter (HOSPITAL_COMMUNITY): Payer: Self-pay

## 2018-06-15 ENCOUNTER — Ambulatory Visit (HOSPITAL_COMMUNITY)
Admission: RE | Admit: 2018-06-15 | Discharge: 2018-06-15 | Disposition: A | Discharge status: Home / Self Care | Payer: Medicaid Other | Source: Ambulatory Visit | Attending: Obstetrics and Gynecology | Admitting: Obstetrics and Gynecology

## 2018-06-15 ENCOUNTER — Ambulatory Visit (HOSPITAL_COMMUNITY): Payer: Medicaid Other | Admitting: *Deleted

## 2018-06-15 ENCOUNTER — Other Ambulatory Visit: Payer: Self-pay

## 2018-06-15 VITALS — BP 159/93 | HR 79

## 2018-06-15 DIAGNOSIS — O09523 Supervision of elderly multigravida, third trimester: Secondary | ICD-10-CM

## 2018-06-15 DIAGNOSIS — O09293 Supervision of pregnancy with other poor reproductive or obstetric history, third trimester: Secondary | ICD-10-CM

## 2018-06-15 DIAGNOSIS — O0990 Supervision of high risk pregnancy, unspecified, unspecified trimester: Secondary | ICD-10-CM | POA: Diagnosis present

## 2018-06-15 DIAGNOSIS — O34219 Maternal care for unspecified type scar from previous cesarean delivery: Secondary | ICD-10-CM | POA: Insufficient documentation

## 2018-06-15 DIAGNOSIS — O99333 Smoking (tobacco) complicating pregnancy, third trimester: Secondary | ICD-10-CM

## 2018-06-15 DIAGNOSIS — O99213 Obesity complicating pregnancy, third trimester: Secondary | ICD-10-CM | POA: Diagnosis not present

## 2018-06-15 DIAGNOSIS — O10919 Unspecified pre-existing hypertension complicating pregnancy, unspecified trimester: Secondary | ICD-10-CM | POA: Diagnosis present

## 2018-06-15 DIAGNOSIS — O10913 Unspecified pre-existing hypertension complicating pregnancy, third trimester: Secondary | ICD-10-CM | POA: Diagnosis not present

## 2018-06-15 DIAGNOSIS — O283 Abnormal ultrasonic finding on antenatal screening of mother: Secondary | ICD-10-CM

## 2018-06-15 DIAGNOSIS — Z3A32 32 weeks gestation of pregnancy: Secondary | ICD-10-CM

## 2018-06-18 ENCOUNTER — Other Ambulatory Visit (HOSPITAL_COMMUNITY): Payer: Self-pay | Admitting: *Deleted

## 2018-06-18 DIAGNOSIS — O365930 Maternal care for other known or suspected poor fetal growth, third trimester, not applicable or unspecified: Secondary | ICD-10-CM

## 2018-06-19 ENCOUNTER — Ambulatory Visit (HOSPITAL_COMMUNITY): Payer: Medicaid Other

## 2018-06-20 ENCOUNTER — Other Ambulatory Visit: Payer: Self-pay

## 2018-06-20 ENCOUNTER — Other Ambulatory Visit: Payer: Self-pay | Admitting: Family Medicine

## 2018-06-20 ENCOUNTER — Ambulatory Visit (HOSPITAL_COMMUNITY): Payer: Medicaid Other | Admitting: *Deleted

## 2018-06-20 ENCOUNTER — Encounter (HOSPITAL_COMMUNITY): Payer: Self-pay

## 2018-06-20 ENCOUNTER — Inpatient Hospital Stay (HOSPITAL_COMMUNITY)
Admission: AD | Admit: 2018-06-20 | Discharge: 2018-06-20 | Discharge status: Absent without leave | Payer: Medicaid Other | Attending: Obstetrics & Gynecology | Admitting: Obstetrics & Gynecology

## 2018-06-20 ENCOUNTER — Encounter (HOSPITAL_COMMUNITY): Payer: Self-pay | Admitting: *Deleted

## 2018-06-20 ENCOUNTER — Inpatient Hospital Stay (HOSPITAL_COMMUNITY)
Admission: AD | Admit: 2018-06-20 | Discharge: 2018-06-20 | DRG: 784 | Disposition: A | Discharge status: Home / Self Care | Payer: Medicaid Other | Attending: Obstetrics and Gynecology | Admitting: Obstetrics and Gynecology

## 2018-06-20 ENCOUNTER — Other Ambulatory Visit: Payer: Self-pay | Admitting: Obstetrics and Gynecology

## 2018-06-20 ENCOUNTER — Inpatient Hospital Stay (HOSPITAL_COMMUNITY)
Admission: AD | Admit: 2018-06-20 | Discharge: 2018-06-24 | Disposition: A | Discharge status: Home / Self Care | Payer: Medicaid Other | Source: Ambulatory Visit | Attending: Obstetrics and Gynecology | Admitting: Obstetrics and Gynecology

## 2018-06-20 ENCOUNTER — Ambulatory Visit (HOSPITAL_COMMUNITY)
Admission: RE | Admit: 2018-06-20 | Discharge: 2018-06-20 | Disposition: A | Discharge status: Home / Self Care | Payer: Medicaid Other | Source: Ambulatory Visit | Attending: Obstetrics and Gynecology | Admitting: Obstetrics and Gynecology

## 2018-06-20 VITALS — BP 163/101 | HR 82 | Temp 98.6°F

## 2018-06-20 DIAGNOSIS — O34211 Maternal care for low transverse scar from previous cesarean delivery: Secondary | ICD-10-CM | POA: Diagnosis present

## 2018-06-20 DIAGNOSIS — O99213 Obesity complicating pregnancy, third trimester: Secondary | ICD-10-CM

## 2018-06-20 DIAGNOSIS — Z3A33 33 weeks gestation of pregnancy: Secondary | ICD-10-CM | POA: Diagnosis not present

## 2018-06-20 DIAGNOSIS — O4103X1 Oligohydramnios, third trimester, fetus 1: Secondary | ICD-10-CM | POA: Diagnosis not present

## 2018-06-20 DIAGNOSIS — O99214 Obesity complicating childbirth: Secondary | ICD-10-CM | POA: Diagnosis present

## 2018-06-20 DIAGNOSIS — Z362 Encounter for other antenatal screening follow-up: Secondary | ICD-10-CM | POA: Diagnosis not present

## 2018-06-20 DIAGNOSIS — O359XX0 Maternal care for (suspected) fetal abnormality and damage, unspecified, not applicable or unspecified: Secondary | ICD-10-CM | POA: Diagnosis not present

## 2018-06-20 DIAGNOSIS — O99333 Smoking (tobacco) complicating pregnancy, third trimester: Secondary | ICD-10-CM

## 2018-06-20 DIAGNOSIS — O1002 Pre-existing essential hypertension complicating childbirth: Secondary | ICD-10-CM | POA: Diagnosis present

## 2018-06-20 DIAGNOSIS — O365930 Maternal care for other known or suspected poor fetal growth, third trimester, not applicable or unspecified: Secondary | ICD-10-CM

## 2018-06-20 DIAGNOSIS — O34219 Maternal care for unspecified type scar from previous cesarean delivery: Secondary | ICD-10-CM

## 2018-06-20 DIAGNOSIS — O4100X0 Oligohydramnios, unspecified trimester, not applicable or unspecified: Secondary | ICD-10-CM | POA: Insufficient documentation

## 2018-06-20 DIAGNOSIS — F17200 Nicotine dependence, unspecified, uncomplicated: Secondary | ICD-10-CM | POA: Diagnosis present

## 2018-06-20 DIAGNOSIS — O0990 Supervision of high risk pregnancy, unspecified, unspecified trimester: Secondary | ICD-10-CM

## 2018-06-20 DIAGNOSIS — O09523 Supervision of elderly multigravida, third trimester: Secondary | ICD-10-CM

## 2018-06-20 DIAGNOSIS — Z6841 Body Mass Index (BMI) 40.0 and over, adult: Secondary | ICD-10-CM

## 2018-06-20 DIAGNOSIS — O99210 Obesity complicating pregnancy, unspecified trimester: Secondary | ICD-10-CM | POA: Diagnosis present

## 2018-06-20 DIAGNOSIS — O4103X0 Oligohydramnios, third trimester, not applicable or unspecified: Secondary | ICD-10-CM | POA: Diagnosis present

## 2018-06-20 DIAGNOSIS — O283 Abnormal ultrasonic finding on antenatal screening of mother: Secondary | ICD-10-CM | POA: Diagnosis present

## 2018-06-20 DIAGNOSIS — Z98891 History of uterine scar from previous surgery: Secondary | ICD-10-CM | POA: Diagnosis present

## 2018-06-20 DIAGNOSIS — O10919 Unspecified pre-existing hypertension complicating pregnancy, unspecified trimester: Secondary | ICD-10-CM

## 2018-06-20 DIAGNOSIS — O365990 Maternal care for other known or suspected poor fetal growth, unspecified trimester, not applicable or unspecified: Secondary | ICD-10-CM | POA: Diagnosis present

## 2018-06-20 DIAGNOSIS — O09293 Supervision of pregnancy with other poor reproductive or obstetric history, third trimester: Secondary | ICD-10-CM | POA: Diagnosis not present

## 2018-06-20 DIAGNOSIS — O09529 Supervision of elderly multigravida, unspecified trimester: Secondary | ICD-10-CM

## 2018-06-20 DIAGNOSIS — O99334 Smoking (tobacco) complicating childbirth: Secondary | ICD-10-CM | POA: Diagnosis present

## 2018-06-20 DIAGNOSIS — Z302 Encounter for sterilization: Secondary | ICD-10-CM | POA: Diagnosis not present

## 2018-06-20 DIAGNOSIS — O10913 Unspecified pre-existing hypertension complicating pregnancy, third trimester: Secondary | ICD-10-CM | POA: Diagnosis not present

## 2018-06-20 DIAGNOSIS — Z1159 Encounter for screening for other viral diseases: Secondary | ICD-10-CM | POA: Diagnosis not present

## 2018-06-20 DIAGNOSIS — O10013 Pre-existing essential hypertension complicating pregnancy, third trimester: Secondary | ICD-10-CM

## 2018-06-20 DIAGNOSIS — O99330 Smoking (tobacco) complicating pregnancy, unspecified trimester: Secondary | ICD-10-CM | POA: Diagnosis present

## 2018-06-20 LAB — CBC
HCT: 39.1 % (ref 36.0–46.0)
Hemoglobin: 12.8 g/dL (ref 12.0–15.0)
MCH: 30.5 pg (ref 26.0–34.0)
MCHC: 32.7 g/dL (ref 30.0–36.0)
MCV: 93.3 fL (ref 80.0–100.0)
Platelets: 229 10*3/uL (ref 150–400)
RBC: 4.19 MIL/uL (ref 3.87–5.11)
RDW: 13.7 % (ref 11.5–15.5)
WBC: 7.3 10*3/uL (ref 4.0–10.5)
nRBC: 0 % (ref 0.0–0.2)

## 2018-06-20 LAB — SARS CORONAVIRUS 2: SARS Coronavirus 2: NOT DETECTED

## 2018-06-20 LAB — COMPREHENSIVE METABOLIC PANEL
ALT: 17 U/L (ref 0–44)
AST: 25 U/L (ref 15–41)
Albumin: 2.9 g/dL — ABNORMAL LOW (ref 3.5–5.0)
Alkaline Phosphatase: 79 U/L (ref 38–126)
Anion gap: 10 (ref 5–15)
BUN: 11 mg/dL (ref 6–20)
CO2: 22 mmol/L (ref 22–32)
Calcium: 9.4 mg/dL (ref 8.9–10.3)
Chloride: 105 mmol/L (ref 98–111)
Creatinine, Ser: 0.96 mg/dL (ref 0.44–1.00)
GFR calc Af Amer: >60 mL/min (ref >60)
GFR calc non Af Amer: >60 mL/min (ref >60)
Glucose, Bld: 77 mg/dL (ref 70–99)
Potassium: 4.7 mmol/L (ref 3.5–5.1)
Sodium: 137 mmol/L (ref 135–145)
Total Bilirubin: 1 mg/dL (ref 0.3–1.2)
Total Protein: 6 g/dL — ABNORMAL LOW (ref 6.5–8.1)

## 2018-06-20 LAB — TYPE AND SCREEN
ABO/RH(D): B POS
Antibody Screen: NEGATIVE

## 2018-06-20 MED ORDER — LABETALOL HCL 5 MG/ML IV SOLN
20.0000 mg | INTRAVENOUS | Status: DC | PRN
  Filled 2018-06-20: qty 4

## 2018-06-20 MED ORDER — DOCUSATE SODIUM 50 MG PO CAPS: 100.0000 mg | ORAL_CAPSULE | Freq: Every day | ORAL | Status: DC

## 2018-06-20 MED ORDER — HYDRALAZINE HCL 20 MG/ML IJ SOLN
INTRAMUSCULAR | Status: AC
  Filled 2018-06-20: qty 1

## 2018-06-20 MED ORDER — DOCUSATE SODIUM 100 MG PO CAPS
100.0000 mg | ORAL_CAPSULE | Freq: Every day | ORAL | Status: DC
  Administered 2018-06-21 – 2018-06-22 (×2): 100 mg via ORAL
  Filled 2018-06-20 (×2): qty 1

## 2018-06-20 MED ORDER — PRENATAL MULTIVITAMIN CH: 1.0000 | ORAL_TABLET | Freq: Every day | ORAL | Status: DC

## 2018-06-20 MED ORDER — HYDRALAZINE HCL 20 MG/ML IJ SOLN
5.0000 mg | INTRAMUSCULAR | Status: DC | PRN
  Administered 2018-06-20 – 2018-06-21 (×2): 5 mg via INTRAVENOUS
  Filled 2018-06-20 (×2): qty 1

## 2018-06-20 MED ORDER — ACETAMINOPHEN 325 MG PO TABS: 650.0000 mg | ORAL_TABLET | ORAL | Status: DC | PRN

## 2018-06-20 MED ORDER — CALCIUM CARBONATE ANTACID 500 MG PO CHEW: 2.0000 | CHEWABLE_TABLET | ORAL | Status: DC | PRN

## 2018-06-20 MED ORDER — LABETALOL HCL 5 MG/ML IV SOLN: 40.0000 mg | INTRAVENOUS | Status: DC | PRN

## 2018-06-20 MED ORDER — LACTATED RINGERS IV SOLN: INTRAVENOUS | Status: DC

## 2018-06-20 MED ORDER — ACETAMINOPHEN 325 MG PO TABS
650.0000 mg | ORAL_TABLET | ORAL | Status: DC | PRN
  Administered 2018-06-20 – 2018-06-21 (×2): 650 mg via ORAL
  Filled 2018-06-20 (×2): qty 2

## 2018-06-20 MED ORDER — LABETALOL HCL 200 MG PO TABS
800.0000 mg | ORAL_TABLET | Freq: Three times a day (TID) | ORAL | Status: DC
  Administered 2018-06-20 – 2018-06-22 (×5): 800 mg via ORAL
  Filled 2018-06-20 (×5): qty 4

## 2018-06-20 MED ORDER — HYDRALAZINE HCL 20 MG/ML IJ SOLN
10.0000 mg | INTRAMUSCULAR | Status: DC | PRN
  Administered 2018-06-20 – 2018-06-22 (×3): 10 mg via INTRAVENOUS
  Filled 2018-06-20: qty 1

## 2018-06-20 MED ORDER — NIFEDIPINE ER OSMOTIC RELEASE 30 MG PO TB24
30.0000 mg | ORAL_TABLET | Freq: Every day | ORAL | Status: DC
  Administered 2018-06-21 – 2018-06-22 (×2): 30 mg via ORAL
  Filled 2018-06-20 (×2): qty 1

## 2018-06-20 MED ORDER — PRENATAL MULTIVITAMIN CH
1.0000 | ORAL_TABLET | Freq: Every day | ORAL | Status: DC
  Administered 2018-06-21: 1 via ORAL
  Filled 2018-06-20 (×3): qty 1

## 2018-06-21 DIAGNOSIS — O4103X1 Oligohydramnios, third trimester, fetus 1: Secondary | ICD-10-CM

## 2018-06-21 DIAGNOSIS — O10913 Unspecified pre-existing hypertension complicating pregnancy, third trimester: Secondary | ICD-10-CM

## 2018-06-21 DIAGNOSIS — O359XX0 Maternal care for (suspected) fetal abnormality and damage, unspecified, not applicable or unspecified: Secondary | ICD-10-CM

## 2018-06-21 DIAGNOSIS — Z3A33 33 weeks gestation of pregnancy: Secondary | ICD-10-CM

## 2018-06-21 LAB — HEPATITIS B SURFACE ANTIGEN: Hepatitis B Surface Ag: NEGATIVE

## 2018-06-21 LAB — RPR: RPR Ser Ql: NONREACTIVE

## 2018-06-21 MED ORDER — ZOLPIDEM TARTRATE 5 MG PO TABS
5.0000 mg | ORAL_TABLET | Freq: Every evening | ORAL | Status: DC | PRN
  Administered 2018-06-21: 5 mg via ORAL
  Filled 2018-06-21: qty 1

## 2018-06-21 NOTE — H&P (Signed)
FACULTY PRACTICE ANTEPARTUM(COMPREHENSIVE) NOTE  Veronica Adams is a 38 y.o. [phone] with Estimated Date of Delivery: 08/07/18   By   [redacted]w[redacted]d  who is admitted for intermittent reverse fetal Doppler flow with CHTN, FGR and oligohydramnios.    Fetal presentation is cephalic. Length of Stay:  1  Days  Date of admission:06/20/2018  Subjective: No complaints Patient reports the fetal movement as active. Patient reports uterine contraction  activity as none. Patient reports  vaginal bleeding as none. Patient describes fluid per vagina as None.  Vitals:  Blood pressure (!) 146/72, pulse 74, temperature 98 F (36.7 C), temperature source Oral, resp. rate 18, height  (1.651 m), weight (!) 138.3 kg, last menstrual period 10/31/2017, SpO2 97 %. Vitals:   06/20/18 2335 06/20/18 2350 06/21/18 0435 06/21/18 0630  BP: (!) 163/94 (!) 152/95 (!) 146/72   Pulse: 71 77 74   Resp: Temp: 98.5 F (36.9 C)  98 F (36.7 C)   TempSrc: Oral  Oral   SpO2: 99% 98% 97%   Weight:    (!) 138.3 kg  Height:     (1.651 m)   Physical Examination:  General appearance - alert, well appearing, and in no distress Abdomen - soft, nontender, nondistended, no masses or organomegaly Fundal Height:  size equals dates Pelvic Exam:  examination not indicated Cervical Exam: Not evaluated. Extremities: extremities normal, atraumatic, no cyanosis or edema with DTRs  Membranes:intact  Fetal Monitoring:  Baseline: 130s bpm, Variability: Fair (1-6 bpm), Accelerations: Reactive and Decelerations: Occurs rare   reactive  Labs:  Results for orders placed or performed during the hospital encounter of 06/20/18 (from the past 24 hour(s))  SARS Coronavirus 2   Collection Time: 06/20/18  6:38 PM  Result Value Ref Range   SARS Coronavirus 2 NOT DETECTED NOT DETECTED  CBC on admission   Collection Time: 06/20/18  6:55 PM  Result Value Ref Range   WBC 7.3 4.0 - 10.5 K/uL   RBC 4.19 3.87 - 5.11 MIL/uL   Hemoglobin 12.8 12.0 - 15.0 g/dL   HCT 13.2 44.0 - 10.2 %   MCV 93.3 80.0 - 100.0 fL   MCH 30.5 26.0 - 34.0 pg   MCHC 32.7 30.0 - 36.0 g/dL   RDW 72.5 36.6 - 44.0 %   Platelets 229 150 - 400 K/uL   nRBC 0.0 0.0 - 0.2 %  Comprehensive metabolic panel   Collection Time: 06/20/18  6:55 PM  Result Value Ref Range   Sodium 137 135 - 145 mmol/L   Potassium 4.7 3.5 - 5.1 mmol/L   Chloride 105 98 - 111 mmol/L   CO2 22 22 - 32 mmol/L   Glucose, Bld 77 70 - 99 mg/dL   BUN 11 6 - 20 mg/dL   Creatinine, Ser 3.47 0.44 - 1.00 mg/dL   Calcium 9.4 8.9 - 42.5 mg/dL   Total Protein 6.0 (L) 6.5 - 8.1 g/dL   Albumin 2.9 (L) 3.5 - 5.0 g/dL   AST 25 15 - 41 U/L   ALT 17 0 - 44 U/L   Alkaline Phosphatase 79 38 - 126 U/L   Total Bilirubin 1.0 0.3 - 1.2 mg/dL   GFR calc non Af Amer >60 >60 mL/min   GFR calc Af Amer >60 >60 mL/min   Anion gap 10 5 - 15  Type and screen [facility]   Collection Time: 06/20/18  6:55 PM  Result Value Ref Range  ABO/RH(D) B POS    Antibody Screen NEG    Sample Expiration      06/23/2018,2359 Performed at [facility], [address] [place], [place] [address]     Imaging Studies:    ----------------------------------------------------------------------  OBSTETRICS REPORT                       (Signed Final 06/20/2018 04:04 pm) ---------------------------------------------------------------------- Patient Info  ID #:       [id number]                          D.O.B.:  15-May-1980 (37 yrs)  Name:       Veronica Adams               Visit Date: 06/20/2018 03:03 pm ---------------------------------------------------------------------- Performed By  Performed By:     Rennie Plowman          Ref. Address:     [address]                    RDMS                                                             [address]  Attending:        Lin Landsman      [facility]                    MD                                        [facility]  Referred By:      [facility] ---------------------------------------------------------------------- Orders   #  Description                          Code         Ordered By   1  [place] MFM UA CORD DOPPLER               76820.02     RAVI SHANKAR   2  [place] MFM FETAL BPP                     19147.8      RAVI [facility]      W/NONSTRESS  ----------------------------------------------------------------------   #  Order #                    Accession #                 Episode #   1  [id number]                  [id number]   2  [id number]                  [id number]  [id number]  ---------------------------------------------------------------------- Indications   [redacted] weeks gestation of pregnancy                Z3A.33   Hypertension - Chronic/Pre-existing            O10.019   (labetalol)   Previous cesarean delivery, antepartum         O34.219   Poor obstetric history: Previous fetal growth  O09.299   restriction (FGR)   Maternal morbid obesity                        O99.210 E66.01   Tobacco use complicating pregnancy, third      O99.333   trimester   Poor obstetrical history (IUGR)                O09.299   Advanced maternal age multigravida 14+,        O59.523   third trimester  ---------------------------------------------------------------------- Vital Signs  Weight (lb): 307                               Height:        5'5"  BMI:         51.08 ---------------------------------------------------------------------- Fetal Evaluation  Num Of Fetuses:         1  Fetal Heart Rate(bpm):  140  Cardiac Activity:       Observed  Presentation:           Cephalic  Placenta:               Posterior  P. Cord Insertion:      Previously Visualized  Amniotic Fluid  AFI FV:      Oligohydramnios  AFI Sum(cm)     %Tile       Largest Pocket(cm)  4.88            < 3         2.87  RUQ(cm)       RLQ(cm)       LUQ(cm)        LLQ(cm)  2.01          0              2.87           0  Comment:    2 x 2 cm pocket noted. ---------------------------------------------------------------------- Biophysical Evaluation  Amniotic F.V:   Within normal limits       F. Tone:        Observed  F. Movement:    Observed                   Score:          8/8  F. Breathing:   Observed ---------------------------------------------------------------------- OB History  Gravidity:    7         Term:   1        Prem:   0        SAB:   0  TOP:          4       Ectopic:  1        Living: 1 ---------------------------------------------------------------------- Gestational Age  LMP:           33w 1d        Date:  10/31/17  EDD:   08/07/18  Best:          33w 1d     Det. By:  LMP  (10/31/17)          EDD:   08/07/18 ---------------------------------------------------------------------- Anatomy  Diaphragm:             Appears normal         Abdominal Wall:         Appears nml (cord                                                                        insert, abd wall)  Stomach:               Appears normal, left   Kidneys:                Appear normal                         sided  Abdomen:               Appears normal         Bladder:                Appears normal ---------------------------------------------------------------------- Doppler - Fetal Vessels  Umbilical Artery                                                            ADFV    RDFV                                                              Yes     Yes ---------------------------------------------------------------------- Impression  Oligohydramnios  IUGR AC< 7th% EFW>10th%  Persistent Absent EDF with intermittent reversed  Chronic hypertension-asymptomatic. ---------------------------------------------------------------------- Recommendations  Patient sent to hospital for admission  Betamethasone administration if not previously given.  Fluid hydration  Daily NST, Repeat Dopplers on  Friday with BPP, if  oligohydramnios resolves and UA Dopplers improve to  persistent absent EDF, consider delivery at 34 however, if  oligohydramnios persist and intermittent REDF is present  delivery after BMZ administration.  Discussed plan of care with Dr. Alvester Morin. ----------------------------------------------------------------------               Lin Landsman, MD Electronically Signed Final Report   06/20/2018 04:04 pm     Medications:  Scheduled . docusate sodium  100 mg Oral Daily  . hydrALAZINE      . labetalol  800 mg Oral TID  . NIFEdipine  30 mg Oral Daily  . prenatal multivitamin  1 tablet Oral Q1200   I have reviewed the patient's current medications.  ASSESSMENT: [id number] [redacted]w[redacted]d Estimated Date of Delivery: 08/07/18  AEDF with intermittent reverse flow  Patient Active Problem List   Diagnosis Date Noted  . Oligohydramnios with  antenatal problem 06/20/2018  . Abnormal fetal ultrasound 06/01/2018  . Obesity in pregnancy 05/21/2018  . BMI 40.0-44.9, adult (HCC) 05/21/2018  . History of intrauterine growth restriction in prior pregnancy, currently pregnant 05/21/2018  . AMA (advanced maternal age) multigravida 35+ 04/12/2018  . Supervision of high risk pregnancy, antepartum 04/11/2018  . Previous cesarean delivery affecting pregnancy, antepartum 04/11/2018  . Tobacco use affecting pregnancy, antepartum 04/11/2018  . Chronic hypertension affecting pregnancy 01/15/2015  . Left breast abscess 01/15/2015    PLAN: >twice daily fetal monitoring >BP management >S/P betamethasone 5/13, 5/14,  >repeat Doppler studies 6/5 and if no improvement patient will be delivered >neonatology consult pending  Pt is aware of clinical management status  Amaryllis DykeLuther H Mikayah Joy 06/21/2018,7:16 AM

## 2018-06-22 ENCOUNTER — Inpatient Hospital Stay (HOSPITAL_COMMUNITY): Payer: Medicaid Other | Admitting: Anesthesiology

## 2018-06-22 ENCOUNTER — Ambulatory Visit (HOSPITAL_COMMUNITY): Payer: Medicaid Other

## 2018-06-22 ENCOUNTER — Encounter (HOSPITAL_COMMUNITY): Payer: Self-pay | Admitting: Anesthesiology

## 2018-06-22 ENCOUNTER — Inpatient Hospital Stay (HOSPITAL_COMMUNITY): Payer: Medicaid Other

## 2018-06-22 ENCOUNTER — Encounter (HOSPITAL_COMMUNITY)
Admission: AD | Disposition: A | Discharge status: Home / Self Care | Payer: Self-pay | Source: Ambulatory Visit | Attending: Obstetrics and Gynecology

## 2018-06-22 DIAGNOSIS — O365990 Maternal care for other known or suspected poor fetal growth, unspecified trimester, not applicable or unspecified: Secondary | ICD-10-CM | POA: Diagnosis present

## 2018-06-22 DIAGNOSIS — Z3A33 33 weeks gestation of pregnancy: Secondary | ICD-10-CM

## 2018-06-22 DIAGNOSIS — O10013 Pre-existing essential hypertension complicating pregnancy, third trimester: Secondary | ICD-10-CM

## 2018-06-22 DIAGNOSIS — Z302 Encounter for sterilization: Secondary | ICD-10-CM

## 2018-06-22 DIAGNOSIS — O99213 Obesity complicating pregnancy, third trimester: Secondary | ICD-10-CM

## 2018-06-22 DIAGNOSIS — Z362 Encounter for other antenatal screening follow-up: Secondary | ICD-10-CM

## 2018-06-22 DIAGNOSIS — O34219 Maternal care for unspecified type scar from previous cesarean delivery: Secondary | ICD-10-CM

## 2018-06-22 DIAGNOSIS — O09523 Supervision of elderly multigravida, third trimester: Secondary | ICD-10-CM

## 2018-06-22 DIAGNOSIS — O99333 Smoking (tobacco) complicating pregnancy, third trimester: Secondary | ICD-10-CM

## 2018-06-22 DIAGNOSIS — O09293 Supervision of pregnancy with other poor reproductive or obstetric history, third trimester: Secondary | ICD-10-CM

## 2018-06-22 DIAGNOSIS — O4103X0 Oligohydramnios, third trimester, not applicable or unspecified: Secondary | ICD-10-CM

## 2018-06-22 DIAGNOSIS — O365930 Maternal care for other known or suspected poor fetal growth, third trimester, not applicable or unspecified: Secondary | ICD-10-CM

## 2018-06-22 DIAGNOSIS — O1002 Pre-existing essential hypertension complicating childbirth: Secondary | ICD-10-CM

## 2018-06-22 LAB — CBC
HCT: 37.6 % (ref 36.0–46.0)
HCT: 38.5 % (ref 36.0–46.0)
Hemoglobin: 12.4 g/dL (ref 12.0–15.0)
Hemoglobin: 13 g/dL (ref 12.0–15.0)
MCH: 30.2 pg (ref 26.0–34.0)
MCH: 30.6 pg (ref 26.0–34.0)
MCHC: 33 g/dL (ref 30.0–36.0)
MCHC: 33.8 g/dL (ref 30.0–36.0)
MCV: 91.7 fL (ref 80.0–100.0)
MCV: 90.6 fL (ref 80.0–100.0)
Platelets: 211 10*3/uL (ref 150–400)
Platelets: 204 10*3/uL (ref 150–400)
RBC: 4.1 MIL/uL (ref 3.87–5.11)
RBC: 4.25 MIL/uL (ref 3.87–5.11)
RDW: 13.7 % (ref 11.5–15.5)
RDW: 13.7 % (ref 11.5–15.5)
WBC: 7.3 10*3/uL (ref 4.0–10.5)
WBC: 7.9 10*3/uL (ref 4.0–10.5)
nRBC: 0 % (ref 0.0–0.2)
nRBC: 0 % (ref 0.0–0.2)

## 2018-06-22 LAB — CREATININE, SERUM
Creatinine, Ser: 0.7 mg/dL (ref 0.44–1.00)
GFR calc Af Amer: >60 mL/min (ref >60)
GFR calc non Af Amer: >60 mL/min (ref >60)

## 2018-06-22 LAB — RUBELLA SCREEN: Rubella: 1.07 index (ref >0.99)

## 2018-06-22 SURGERY — Surgical Case
Anesthesia: Spinal | Site: Abdomen | Wound class: Clean Contaminated

## 2018-06-22 MED ORDER — NALBUPHINE HCL 10 MG/ML IJ SOLN: 5.0000 mg | Freq: Once | INTRAMUSCULAR | Status: DC | PRN

## 2018-06-22 MED ORDER — FENTANYL CITRATE (PF) 100 MCG/2ML IJ SOLN: 25.0000 ug | INTRAMUSCULAR | Status: DC | PRN

## 2018-06-22 MED ORDER — WITCH HAZEL-GLYCERIN EX PADS: 1.0000 "application " | MEDICATED_PAD | CUTANEOUS | Status: DC | PRN

## 2018-06-22 MED ORDER — SODIUM CHLORIDE 0.9 % IV SOLN
INTRAVENOUS | Status: DC | PRN
  Administered 2018-06-22: 60 ug/min via INTRAVENOUS

## 2018-06-22 MED ORDER — ACETAMINOPHEN 160 MG/5ML PO SOLN: 325.0000 mg | Freq: Once | ORAL | Status: DC | PRN

## 2018-06-22 MED ORDER — NALBUPHINE HCL 10 MG/ML IJ SOLN
5.0000 mg | INTRAMUSCULAR | Status: DC | PRN
  Administered 2018-06-22 – 2018-06-23 (×3): 5 mg via INTRAVENOUS
  Filled 2018-06-22 (×3): qty 1

## 2018-06-22 MED ORDER — DIPHENHYDRAMINE HCL 50 MG/ML IJ SOLN
12.5000 mg | INTRAMUSCULAR | Status: DC | PRN
  Administered 2018-06-22 – 2018-06-23 (×2): 12.5 mg via INTRAVENOUS
  Filled 2018-06-22: qty 1

## 2018-06-22 MED ORDER — TETANUS-DIPHTH-ACELL PERTUSSIS 5-2.5-18.5 LF-MCG/0.5 IM SUSP: 0.5000 mL | Freq: Once | INTRAMUSCULAR | Status: DC

## 2018-06-22 MED ORDER — FENTANYL CITRATE (PF) 100 MCG/2ML IJ SOLN
INTRAMUSCULAR | Status: AC
  Filled 2018-06-22: qty 2

## 2018-06-22 MED ORDER — SCOPOLAMINE 1 MG/3DAYS TD PT72
1.0000 | MEDICATED_PATCH | Freq: Once | TRANSDERMAL | Status: DC
  Administered 2018-06-22: 1.5 mg via TRANSDERMAL

## 2018-06-22 MED ORDER — MORPHINE SULFATE (PF) 0.5 MG/ML IJ SOLN
INTRAMUSCULAR | Status: DC | PRN
  Administered 2018-06-22: .15 mg via INTRATHECAL

## 2018-06-22 MED ORDER — SENNOSIDES-DOCUSATE SODIUM 8.6-50 MG PO TABS
2.0000 | ORAL_TABLET | ORAL | Status: DC
  Administered 2018-06-23 – 2018-06-24 (×2): 2 via ORAL
  Filled 2018-06-22 (×2): qty 2

## 2018-06-22 MED ORDER — ACETAMINOPHEN 325 MG PO TABS: 325.0000 mg | ORAL_TABLET | Freq: Once | ORAL | Status: DC | PRN

## 2018-06-22 MED ORDER — ONDANSETRON HCL 4 MG/2ML IJ SOLN
INTRAMUSCULAR | Status: DC | PRN
  Administered 2018-06-22: 4 mg via INTRAVENOUS

## 2018-06-22 MED ORDER — NALBUPHINE HCL 10 MG/ML IJ SOLN: 5.0000 mg | INTRAMUSCULAR | Status: DC | PRN

## 2018-06-22 MED ORDER — SCOPOLAMINE 1 MG/3DAYS TD PT72
MEDICATED_PATCH | TRANSDERMAL | Status: AC
  Filled 2018-06-22: qty 1

## 2018-06-22 MED ORDER — ACETAMINOPHEN 10 MG/ML IV SOLN: 1000.0000 mg | Freq: Once | INTRAVENOUS | Status: DC | PRN

## 2018-06-22 MED ORDER — DIPHENHYDRAMINE HCL 25 MG PO CAPS: 25.0000 mg | ORAL_CAPSULE | ORAL | Status: DC | PRN

## 2018-06-22 MED ORDER — DIPHENHYDRAMINE HCL 25 MG PO CAPS
25.0000 mg | ORAL_CAPSULE | Freq: Four times a day (QID) | ORAL | Status: DC | PRN
  Administered 2018-06-23: 25 mg via ORAL
  Filled 2018-06-22: qty 1

## 2018-06-22 MED ORDER — SODIUM CHLORIDE 0.9% FLUSH: 3.0000 mL | INTRAVENOUS | Status: DC | PRN

## 2018-06-22 MED ORDER — ENOXAPARIN SODIUM 80 MG/0.8ML ~~LOC~~ SOLN: 70.0000 mg | SUBCUTANEOUS | Status: DC

## 2018-06-22 MED ORDER — KETOROLAC TROMETHAMINE 30 MG/ML IJ SOLN
30.0000 mg | Freq: Four times a day (QID) | INTRAMUSCULAR | Status: AC | PRN
  Administered 2018-06-22: 30 mg via INTRAVENOUS

## 2018-06-22 MED ORDER — SOD CITRATE-CITRIC ACID 500-334 MG/5ML PO SOLN
ORAL | Status: AC
  Administered 2018-06-22: 15 mL
  Filled 2018-06-22: qty 15

## 2018-06-22 MED ORDER — COCONUT OIL OIL: 1.0000 "application " | TOPICAL_OIL | Status: DC | PRN

## 2018-06-22 MED ORDER — OXYCODONE HCL 5 MG PO TABS
5.0000 mg | ORAL_TABLET | ORAL | Status: DC | PRN
  Administered 2018-06-24: 5 mg via ORAL
  Filled 2018-06-22: qty 1

## 2018-06-22 MED ORDER — SIMETHICONE 80 MG PO CHEW
80.0000 mg | CHEWABLE_TABLET | ORAL | Status: DC
  Administered 2018-06-23: 80 mg via ORAL
  Filled 2018-06-22 (×2): qty 1

## 2018-06-22 MED ORDER — MENTHOL 3 MG MT LOZG: 1.0000 | LOZENGE | OROMUCOSAL | Status: DC | PRN

## 2018-06-22 MED ORDER — SIMETHICONE 80 MG PO CHEW
80.0000 mg | CHEWABLE_TABLET | Freq: Three times a day (TID) | ORAL | Status: DC
  Administered 2018-06-22 – 2018-06-24 (×4): 80 mg via ORAL
  Filled 2018-06-22 (×3): qty 1

## 2018-06-22 MED ORDER — DEXTROSE 5 % IV SOLN
3.0000 g | INTRAVENOUS | Status: AC
  Administered 2018-06-22: 3 g via INTRAVENOUS
  Filled 2018-06-22 (×2): qty 3000

## 2018-06-22 MED ORDER — NALOXONE HCL 0.4 MG/ML IJ SOLN: 0.4000 mg | INTRAMUSCULAR | Status: DC | PRN

## 2018-06-22 MED ORDER — GABAPENTIN 100 MG PO CAPS
100.0000 mg | ORAL_CAPSULE | Freq: Three times a day (TID) | ORAL | Status: DC
  Administered 2018-06-22 – 2018-06-24 (×5): 100 mg via ORAL
  Filled 2018-06-22 (×5): qty 1

## 2018-06-22 MED ORDER — PRENATAL MULTIVITAMIN CH
1.0000 | ORAL_TABLET | Freq: Every day | ORAL | Status: DC
  Administered 2018-06-23: 11:00:00 1 via ORAL
  Filled 2018-06-22: qty 1

## 2018-06-22 MED ORDER — OXYTOCIN 40 UNITS IN NORMAL SALINE INFUSION - SIMPLE MED: 2.5000 [IU]/h | INTRAVENOUS | Status: AC

## 2018-06-22 MED ORDER — PHENYLEPHRINE 40 MCG/ML (10ML) SYRINGE FOR IV PUSH (FOR BLOOD PRESSURE SUPPORT): PREFILLED_SYRINGE | INTRAVENOUS | Status: DC | PRN

## 2018-06-22 MED ORDER — HYDRALAZINE HCL 20 MG/ML IJ SOLN
10.0000 mg | Freq: Once | INTRAMUSCULAR | Status: AC
  Administered 2018-06-22: 10 mg via INTRAVENOUS

## 2018-06-22 MED ORDER — MORPHINE SULFATE (PF) 0.5 MG/ML IJ SOLN
INTRAMUSCULAR | Status: AC
  Filled 2018-06-22: qty 10

## 2018-06-22 MED ORDER — MEPERIDINE HCL 25 MG/ML IJ SOLN: 6.2500 mg | INTRAMUSCULAR | Status: DC | PRN

## 2018-06-22 MED ORDER — LACTATED RINGERS IV SOLN
INTRAVENOUS | Status: DC | PRN
  Administered 2018-06-22 (×2): via INTRAVENOUS

## 2018-06-22 MED ORDER — OXYTOCIN 40 UNITS IN NORMAL SALINE INFUSION - SIMPLE MED
INTRAVENOUS | Status: AC
  Filled 2018-06-22: qty 1000

## 2018-06-22 MED ORDER — BUPIVACAINE IN DEXTROSE 0.75-8.25 % IT SOLN
INTRATHECAL | Status: DC | PRN
  Administered 2018-06-22: 1.5 mg via INTRATHECAL

## 2018-06-22 MED ORDER — NALOXONE HCL 4 MG/10ML IJ SOLN
1.0000 ug/kg/h | INTRAVENOUS | Status: DC | PRN
  Filled 2018-06-22: qty 5

## 2018-06-22 MED ORDER — HYDROCHLOROTHIAZIDE 25 MG PO TABS
25.0000 mg | ORAL_TABLET | Freq: Every day | ORAL | Status: DC
  Administered 2018-06-22 – 2018-06-24 (×3): 25 mg via ORAL
  Filled 2018-06-22 (×3): qty 1

## 2018-06-22 MED ORDER — PHENYLEPHRINE HCL-NACL 20-0.9 MG/250ML-% IV SOLN
INTRAVENOUS | Status: AC
  Filled 2018-06-22: qty 250

## 2018-06-22 MED ORDER — PROMETHAZINE HCL 25 MG/ML IJ SOLN: 6.2500 mg | INTRAMUSCULAR | Status: DC | PRN

## 2018-06-22 MED ORDER — SIMETHICONE 80 MG PO CHEW
80.0000 mg | CHEWABLE_TABLET | ORAL | Status: DC | PRN
  Filled 2018-06-22: qty 1

## 2018-06-22 MED ORDER — KETOROLAC TROMETHAMINE 30 MG/ML IJ SOLN
INTRAMUSCULAR | Status: AC
  Filled 2018-06-22: qty 1

## 2018-06-22 MED ORDER — LACTATED RINGERS IV SOLN: INTRAVENOUS | Status: DC

## 2018-06-22 MED ORDER — LACTATED RINGERS IV SOLN
INTRAVENOUS | Status: DC
  Administered 2018-06-23: 04:00:00 via INTRAVENOUS

## 2018-06-22 MED ORDER — DIBUCAINE (PERIANAL) 1 % EX OINT: 1.0000 "application " | TOPICAL_OINTMENT | CUTANEOUS | Status: DC | PRN

## 2018-06-22 MED ORDER — LISINOPRIL 20 MG PO TABS
20.0000 mg | ORAL_TABLET | Freq: Every day | ORAL | Status: DC
  Administered 2018-06-22 – 2018-06-23 (×2): 20 mg via ORAL
  Filled 2018-06-22 (×4): qty 1

## 2018-06-22 MED ORDER — DIPHENHYDRAMINE HCL 50 MG/ML IJ SOLN
INTRAMUSCULAR | Status: AC
  Filled 2018-06-22: qty 1

## 2018-06-22 MED ORDER — FENTANYL CITRATE (PF) 100 MCG/2ML IJ SOLN
INTRAMUSCULAR | Status: DC | PRN
  Administered 2018-06-22: 15 ug via INTRATHECAL

## 2018-06-22 MED ORDER — IBUPROFEN 800 MG PO TABS
800.0000 mg | ORAL_TABLET | Freq: Three times a day (TID) | ORAL | Status: DC
  Administered 2018-06-23 – 2018-06-24 (×5): 800 mg via ORAL
  Filled 2018-06-22 (×6): qty 1

## 2018-06-22 MED ORDER — PHENYLEPHRINE 40 MCG/ML (10ML) SYRINGE FOR IV PUSH (FOR BLOOD PRESSURE SUPPORT)
PREFILLED_SYRINGE | INTRAVENOUS | Status: AC
  Filled 2018-06-22: qty 10

## 2018-06-22 MED ORDER — ONDANSETRON HCL 4 MG/2ML IJ SOLN: 4.0000 mg | Freq: Three times a day (TID) | INTRAMUSCULAR | Status: DC | PRN

## 2018-06-22 MED ORDER — HYDRALAZINE HCL 20 MG/ML IJ SOLN
INTRAMUSCULAR | Status: AC
  Filled 2018-06-22: qty 1

## 2018-06-22 MED ORDER — KETOROLAC TROMETHAMINE 30 MG/ML IJ SOLN: 30.0000 mg | Freq: Four times a day (QID) | INTRAMUSCULAR | Status: AC | PRN

## 2018-06-22 MED ORDER — SODIUM CHLORIDE 0.9 % IV SOLN
INTRAVENOUS | Status: DC | PRN
  Administered 2018-06-22: 16:00:00 via INTRAVENOUS

## 2018-06-22 SURGICAL SUPPLY — 36 items
APL SKNCLS STERI-STRIP NONHPOA (GAUZE/BANDAGES/DRESSINGS) ×2
BENZOIN TINCTURE PRP APPL 2/3 (GAUZE/BANDAGES/DRESSINGS) ×2 IMPLANT
BRR ADH 6X5 SEPRAFILM 1 SHT (MISCELLANEOUS)
CHLORAPREP W/TINT 26ML (MISCELLANEOUS) ×4 IMPLANT
CLAMP CORD UMBIL (MISCELLANEOUS) IMPLANT
CLOSURE STERI STRIP 1/2 X4 (GAUZE/BANDAGES/DRESSINGS) ×2 IMPLANT
DRSG OPSITE POSTOP 4X10 (GAUZE/BANDAGES/DRESSINGS) ×4 IMPLANT
ELECT REM PT RETURN 9FT ADLT (ELECTROSURGICAL) ×4
ELECTRODE REM PT RTRN 9FT ADLT (ELECTROSURGICAL) ×2 IMPLANT
EXTRACTOR VACUUM M CUP 4 TUBE (SUCTIONS) IMPLANT
EXTRACTOR VACUUM M CUP 4' TUBE (SUCTIONS)
GLOVE BIOGEL PI IND STRL 6.5 (GLOVE) ×2 IMPLANT
GLOVE BIOGEL PI IND STRL 7.0 (GLOVE) ×2 IMPLANT
GLOVE BIOGEL PI INDICATOR 6.5 (GLOVE) ×2
GLOVE BIOGEL PI INDICATOR 7.0 (GLOVE) ×2
GLOVE SURG SS PI 6.0 STRL IVOR (GLOVE) ×4 IMPLANT
GOWN STRL REUS W/TWL LRG LVL3 (GOWN DISPOSABLE) ×8 IMPLANT
KIT ABG SYR 3ML LUER SLIP (SYRINGE) IMPLANT
NDL HYPO 25X5/8 SAFETYGLIDE (NEEDLE) IMPLANT
NEEDLE HYPO 25X5/8 SAFETYGLIDE (NEEDLE) IMPLANT
NS IRRIG 1000ML POUR BTL (IV SOLUTION) ×4 IMPLANT
PACK C SECTION WH (CUSTOM PROCEDURE TRAY) ×4 IMPLANT
PAD ABD 7.5X8 STRL (GAUZE/BANDAGES/DRESSINGS) ×2 IMPLANT
PAD OB MATERNITY 4.3X12.25 (PERSONAL CARE ITEMS) ×4 IMPLANT
PENCIL SMOKE EVAC W/HOLSTER (ELECTROSURGICAL) ×4 IMPLANT
RTRCTR C-SECT PINK 25CM LRG (MISCELLANEOUS) IMPLANT
SEPRAFILM MEMBRANE 5X6 (MISCELLANEOUS) IMPLANT
SPONGE GAUZE 4X4 12PLY STER LF (GAUZE/BANDAGES/DRESSINGS) ×4 IMPLANT
SUT PLAIN 0 NONE (SUTURE) ×2 IMPLANT
SUT PLAIN 2 0 (SUTURE) ×4
SUT PLAIN ABS 2-0 CT1 27XMFL (SUTURE) IMPLANT
SUT VIC AB 0 CT1 36 (SUTURE) ×16 IMPLANT
SUT VIC AB 4-0 KS 27 (SUTURE) ×4 IMPLANT
TOWEL OR 17X24 6PK STRL BLUE (TOWEL DISPOSABLE) ×4 IMPLANT
TRAY FOLEY W/BAG SLVR 14FR LF (SET/KITS/TRAYS/PACK) ×4 IMPLANT
WATER STERILE IRR 1000ML POUR (IV SOLUTION) ×4 IMPLANT

## 2018-06-22 NOTE — Progress Notes (Signed)
Patient ID: Veronica Adams, female   DOB: 07-22-1980, 38 y.o.   MRN: [id number] FACULTY PRACTICE ANTEPARTUM(COMPREHENSIVE) NOTE  MAHNIYA SABAS is a 38 y.o. A2Q3335 at [redacted]w[redacted]d  who is admitted for inpatient management of CHTN, oligo, IUGR and abn dopplers.    Fetal presentation is cephalic. Length of Stay:  2  Days  Date of admission:06/20/2018  Subjective: Patient is doing well without complaints. She denies HA, visual changes, RUQ/epigastric pain, nausea/emesis Patient reports the fetal movement as active. Patient reports uterine contraction  activity as none. Patient reports  vaginal bleeding as none. Patient describes fluid per vagina as None.  Vitals:  Blood pressure 137/89, pulse 66, temperature 98.4 F (36.9 C), temperature source Oral, resp. rate 18, height 5\' 5"  (1.651 m), weight (!) 138.3 kg, last menstrual period 10/31/2017, SpO2 100 %. Vitals:   06/21/18 1957 06/21/18 2333 06/22/18 0411 06/22/18 0745  BP: (!) 162/90 (!) 149/88 135/81 137/89  Pulse: 73 66 69 66  Resp:  18 18 18   Temp:  98.1 F (36.7 C) 98.3 F (36.8 C) 98.4 F (36.9 C)  TempSrc:   Oral Oral  SpO2:   100% 100%  Weight:      Height:       Physical Examination: GENERAL: Well-developed, well-nourished female in no acute distress.  LUNGS: Clear to auscultation bilaterally.  HEART: Regular rate and rhythm. ABDOMEN: Soft, nontender, gravid PELVIC: Not indicated EXTREMITIES: No cyanosis, clubbing, or edema, 2+ distal pulses.   Fetal Monitoring:  Baseline 135, mod variability, 10x10 accels, no decels     Labs:  Results for orders placed or performed during the hospital encounter of 06/20/18 (from the past 24 hour(s))  Hepatitis B surface antigen   Collection Time: 06/21/18  4:52 PM  Result Value Ref Range   Hepatitis B Surface Ag Negative Negative  Rubella screen   Collection Time: 06/21/18  4:52 PM  Result Value Ref Range   Rubella 1.07 Immune >0.99 index    Imaging Studies:       Medications:  Scheduled . docusate sodium  100 mg Oral Daily  . labetalol  800 mg Oral TID  . NIFEdipine  30 mg Oral Daily  . prenatal multivitamin  1 tablet Oral Q1200   I have reviewed the patient's current medications.  ASSESSMENT: [id number] [redacted]w[redacted]d Estimated Date of Delivery: 08/07/18  Patient Active Problem List   Diagnosis Date Noted  . Oligohydramnios with antenatal problem 06/20/2018  . Abnormal fetal ultrasound 06/01/2018  . Obesity in pregnancy 05/21/2018  . BMI 40.0-44.9, adult (HCC) 05/21/2018  . History of intrauterine growth restriction in prior pregnancy, currently pregnant 05/21/2018  . AMA (advanced maternal age) multigravida 35+ 04/12/2018  . Supervision of high risk pregnancy, antepartum 04/11/2018  . Previous cesarean delivery affecting pregnancy, antepartum 04/11/2018  . Tobacco use affecting pregnancy, antepartum 04/11/2018  . Chronic hypertension affecting pregnancy 01/15/2015  . Left breast abscess 01/15/2015    PLAN: 38 yo G7P1051 at [redacted]w[redacted]d with CHTN, oligo, IUGR and abd dopplers - BP stable on current meds - Follow up BPP today - delivery and management plan pending ultrasound - Continue current care  Melvia Matousek 06/22/2018,9:24 AM

## 2018-06-22 NOTE — Anesthesia Procedure Notes (Signed)
Spinal  Start time: 06/22/2018 3:15 PM End time: 06/22/2018 3:19 PM Staffing Anesthesiologist: Shelton Silvas, MD Performed: anesthesiologist  Preanesthetic Checklist Completed: patient identified, site marked, surgical consent, pre-op evaluation, timeout performed, IV checked, risks and benefits discussed and monitors and equipment checked Spinal Block Patient position: sitting Prep: site prepped and draped and DuraPrep Location: L3-4 Injection technique: single-shot Needle Needle type: Pencan  Needle gauge: 24 G Needle length: 10 cm Needle insertion depth: 10 cm Additional Notes Patient tolerated well. No immediate complications.

## 2018-06-22 NOTE — Progress Notes (Signed)
Patient move to Operating Room.  Report Given to [employer].

## 2018-06-22 NOTE — Anesthesia Postprocedure Evaluation (Signed)
Anesthesia Post Note  Patient: Veronica Adams  Procedure(s) Performed: CESAREAN SECTION WITH BILATERAL TUBAL LIGATION (N/A Abdomen)     Patient location during evaluation: PACU Anesthesia Type: Spinal Level of consciousness: oriented and awake and alert Pain management: pain level controlled Vital Signs Assessment: post-procedure vital signs reviewed and stable Respiratory status: spontaneous breathing, respiratory function stable and patient connected to nasal cannula oxygen Cardiovascular status: blood pressure returned to baseline and stable Postop Assessment: no headache, no backache and no apparent nausea or vomiting Anesthetic complications: no    Last Vitals:  Vitals:   06/22/18 1730 06/22/18 1741  BP: (!) 166/96 (!) 164/100  Pulse: (!) 58 60  Resp: 19 15  Temp:    SpO2: 94% 100%    Last Pain:  Vitals:   06/22/18 1741  TempSrc:   PainSc: 0-No pain   Pain Goal: Patients Stated Pain Goal: 5 (06/22/18 0746)  LLE Motor Response: Purposeful movement (06/22/18 1741) LLE Sensation: Tingling (06/22/18 1741) RLE Motor Response: Purposeful movement (06/22/18 1741) RLE Sensation: Tingling (06/22/18 1741)     Epidural/Spinal Function Cutaneous sensation: Tingles (06/22/18 1741), Patient able to flex knees: Yes (06/22/18 1741), Patient able to lift hips off bed: No (06/22/18 1741), Back pain beyond tenderness at insertion site: No (06/22/18 1741), Progressively worsening motor and/or sensory loss: No (06/22/18 1741), Bowel and/or bladder incontinence post epidural: No (06/22/18 1741)  Shelton Silvas

## 2018-06-22 NOTE — Discharge Summary (Signed)
Obstetrics Discharge Summary [facility]   Patient Name: Veronica Adams DOB: 12-26-1980 MRN: [id number]  Date of admission: 06/20/2018 Delivering MD: Catalina Antigua   Date of discharge: 06/24/2018  Admitting diagnosis: PREG Intrauterine pregnancy: [redacted]w[redacted]d     Secondary diagnosis:   Principal Problem:   Pregnancy affected by fetal growth restriction Active Problems:   Chronic hypertension affecting pregnancy   Previous cesarean delivery affecting pregnancy, antepartum   Tobacco use affecting pregnancy, antepartum   AMA (advanced maternal age) multigravida 35+   Obesity in pregnancy   BMI 40.0-44.9, adult (HCC)   Abnormal fetal ultrasound   Oligohydramnios with antenatal problem    Discharge diagnosis: Preterm Pregnancy Delivered and CHTN                                            Postpartum procedures: R Tubal Ligation - History of Left Salpingectomy 2/2 Ectopic Pregnancy Complications: none  Outpatient Follow-Up: [ ]  incision check [ ]  BP check  Hospital course: JACQUA Adams is a 38 y.o. [redacted]w[redacted]d who was admitted for intermittent reverse end diastolic flow in setting of fetal growth restriction and chronic hypertension. Her pregnancy was complicated by chronic HTN (labetalol and procardia), prior C/S (NRFHT, Stage II labor), AMA, obesity. She was admitted 5/13-5/14 for same indication and received betamethasone at that time x 2 doses. She left against medical advice, but she returned when she noticed some vaginal bleeding. Decision made to proceed with delivery at 33w3 because of persistently absent end diastolic and fetal growth restriction with chronic hypertension. Delivery was uncomplicated. Please see delivery/op note for additional details. Her postpartum course was uncomplicated. She was on Lisinopril intially 20mg  qd (prepreg dose) but increased to 40mg  qd at discharge, as well as HCTZ 25mg  qd for blood pressure control. By day of discharge, she was passing  flatus, urinating, eating and drinking without difficulty. Her pain was well-controlled, and she was discharged home with Percocet & Motrin for. She will follow-up in clinic in 1-2wks for BP and incision check, then 4-6 weeks for a postpartum visit.   Physical exam  Vitals:   06/23/18 1200 06/23/18 1511 06/23/18 1553 06/24/18 0500  BP: (!) 161/97 127/79 (!) 147/84 129/71  Pulse: 74 86 73 82  Resp: 18 18 18 18   Temp: 97.7 F (36.5 C) 98.9 F (37.2 C) 98.1 F (36.7 C) 98.4 F (36.9 C)  TempSrc: Oral Oral Oral   SpO2: 98% 97%    Weight:      Height:       General: alert, cooperative Lochia: appropriate Uterine Fundus: firm Incision: honeycomb clean and dry; replaced on POD#1 DVT Evaluation: No evidence of DVT seen on physical exam.  Labs: Lab Results  Component Value Date   WBC 7.3 06/23/2018   HGB 12.7 06/23/2018   HCT 38.8 06/23/2018   MCV 92.8 06/23/2018   PLT 180 06/23/2018   CMP Latest Ref Rng & Units 06/22/2018  Glucose 70 - 99 mg/dL -  BUN 6 - 20 mg/dL -  Creatinine 1.75 - 1.02 mg/dL 5.85  Sodium 277 - 824 mmol/L -  Potassium 3.5 - 5.1 mmol/L -  Chloride 98 - 111 mmol/L -  CO2 22 - 32 mmol/L -  Calcium 8.9 - 10.3 mg/dL -  Total Protein 6.5 - 8.1 g/dL -  Total Bilirubin 0.3 - 1.2 mg/dL -  Alkaline Phos 38 -  126 U/L -  AST 15 - 41 U/L -  ALT 0 - 44 U/L -    Discharge instructions: Per After Visit Summary and "Baby and Me Booklet"  After visit meds:  Allergies as of 06/24/2018   No Known Allergies     Medication List    STOP taking these medications   acetaminophen 500 MG tablet Commonly known as:  TYLENOL   labetalol 200 MG tablet Commonly known as:  NORMODYNE   NIFEdipine 30 MG 24 hr tablet Commonly known as:  PROCARDIA-XL/NIFEDICAL-XL     TAKE these medications   aspirin EC 81 MG tablet Take 81 mg by mouth daily.   hydrochlorothiazide 25 MG tablet Commonly known as:  HYDRODIURIL Take 1 tablet (25 mg total) by mouth daily.   ibuprofen 800 MG  tablet Commonly known as:  ADVIL Take 1 tablet (800 mg total) by mouth every 8 (eight) hours as needed.   lisinopril 20 MG tablet Commonly known as:  ZESTRIL Take 2 tablets (40 mg total) by mouth daily.   oxyCODONE-acetaminophen 5-325 MG tablet Commonly known as:  Percocet Take 1 tablet by mouth every 4 (four) hours as needed for severe pain.   prenatal vitamin w/FE, FA 27-1 MG Tabs tablet Take 1 tablet by mouth daily at 12 noon.       Postpartum contraception: Tubal Ligation Diet: Routine Diet Activity: Advance as tolerated. Pelvic rest for 6 weeks.   Follow-up Appt:No future appointments. Follow-up Visit:No follow-ups on file.  Please schedule this patient for Postpartum visit in: 4 weeks with the following provider: Any provider For C/S patients schedule nurse incision check in weeks 2 weeks: yes High risk pregnancy complicated by: obesity, cHTN, FGR Delivery mode:  CS Anticipated Birth Control:  BTL done PP PP Procedures needed: BP check, incision check  Schedule Integrated BH visit: no  Newborn Data: Live born female  Birth Weight: 2 lb 10 oz (1190 g) APGAR: 7, 9  Newborn Delivery   Birth date/time:  06/22/2018 15:46:00 Delivery type:  C-Section, Low Transverse Trial of labor:  No     Baby Feeding: Bottle Disposition:NICU

## 2018-06-22 NOTE — Op Note (Signed)
Veronica Adams: 06/20/2018 - 06/22/2018  PREOPERATIVE DIAGNOSIS: Intrauterine pregnancy at  [redacted]w[redacted]d weeks gestation; CHTM, IUGR, abnormal dopplers, oligohydramnios and undesired fertility  POSTOPERATIVE DIAGNOSIS: The same  PROCEDURE:     Cesarean Section and Postpartum Unilateral Tubal Sterilization using Pomeroy method   SURGEON:  Dr. Catalina Antigua  ASSISTANT: Dr. Rhett Bannister  INDICATIONS: Veronica Adams is a 38 y.o. [phone] at [redacted]w[redacted]d scheduled for cesarean section secondary to oligohydramnios and CHTN, abnoraml dopplers, IUGR.  The risks of cesarean section discussed with the patient included but were not limited to: bleeding which may require transfusion or reoperation; infection which may require antibiotics; injury to bowel, bladder, ureters or other surrounding organs; injury to the fetus; need for additional procedures including hysterectomy in the event of a life-threatening hemorrhage; placental abnormalities wth subsequent pregnancies, incisional problems, thromboembolic phenomenon and other postoperative/anesthesia complications. Patient also desires permanent sterilization. Risks and benefits of procedure discussed with patient including permanence of method, bleeding, infection, injury to surrounding organs and need for additional procedures. Risk failure of 0.5-1% with increased risk of ectopic gestation if pregnancy occurs was also discussed with patient. The patient concurred with the proposed plan, giving informed written consent for the procedure.    FINDINGS:  Viable female infant in cephalic presentation.  Apgars 7 and 9.  Clear amniotic fluid.  Intact placenta, three vessel cord.  Normal uterus, right fallopian tube and bilateral ovaries. Left fallopian tube absent  ANESTHESIA:    Spinal INTRAVENOUS FLUIDS:1500 ml ESTIMATED BLOOD LOSS: 600 ml URINE OUTPUT:  100 ml SPECIMENS: Placenta and portion of right fallopian tubesent to pathology COMPLICATIONS: None  immediate  PROCEDURE IN DETAIL:  The patient received intravenous antibiotics and had sequential compression devices applied to her lower extremities while in the preoperative area.  She was then taken to the operating room where anesthesia was induced and was found to be adequate. A foley catheter was placed into her bladder and attached to Veronica Adams gravity. She was then placed in a dorsal supine position with a leftward tilt, and prepped and draped in a sterile manner. After an adequate timeout was performed, a Pfannenstiel skin incision was made with scalpel and carried through to the underlying layer of fascia. The fascia was incised in the midline and this incision was extended bilaterally using the Mayo scissors. Kocher clamps were applied to the superior aspect of the fascial incision and the underlying rectus muscles were dissected off bluntly. A similar process was carried out on the inferior aspect of the facial incision. The rectus muscles were separated in the midline bluntly and the peritoneum was entered bluntly. The Alexis self-retaining retractor was introduced into the abdominal cavity. Attention was turned to the lower uterine segment where a transverse hysterotomy was made with a scalpel and extended bilaterally bluntly. The infant was successfully delivered and delayed cord clamping was performed for 1 minute. The cord was clamped and cut and infant was handed over to awaiting neonatology team. Uterine massage was then administered and the placenta delivered intact with three-vessel cord. The uterus was cleared of clot and debris.  The hysterotomy was closed with 0 Vicryl in a running locked fashion, and an imbricating layer was also placed with a 0 Vicryl. The patient's right fallopian tube was then identified, brought to the incision, and grasped with a Babcock clamp. The tube was then followed out to the fimbria. The Babcock clamp was then used to grasp the tube approximately 4 cm from the  cornual region.  A 3 cm segment of the tube was then ligated with free tie of plain gut suture, transected and excised. Good hemostasis was noted and the tube was returned to the abdomen. The pelvis copiously irrigated and cleared of all clot and debris. Hemostasis was confirmed on all surfaces.  The peritoneum and the muscles were reapproximated using 0 vicryl interrupted stitches. The fascia was then closed using 0 Vicryl in a running fashion.  The subcutaneous layer was reapproximated with plain gut and the skin was closed in a subcuticular fashion using 3.0 Vicryl. The patient tolerated the procedure well. Sponge, lap, instrument and needle counts were correct x 2. She was taken to the recovery room in stable condition.    Veronica Adams  06/22/2018 4:20 PM

## 2018-06-22 NOTE — Anesthesia Preprocedure Evaluation (Addendum)
Anesthesia Evaluation  Patient identified by MRN, date of birth, ID band Patient awake    Reviewed: Allergy & Precautions, NPO status , Patient's Chart, lab work & pertinent test results  Airway Mallampati: III  TM Distance: >3 FB Neck ROM: Full    Dental  (+) Teeth Intact, Dental Advisory Given   Pulmonary asthma , former smoker,    breath sounds clear to auscultation       Cardiovascular hypertension, Pt. on home beta blockers and Pt. on medications  Rhythm:Regular Rate:Normal     Neuro/Psych    GI/Hepatic negative GI ROS, Neg liver ROS,   Endo/Other  negative endocrine ROS  Renal/GU negative Renal ROS     Musculoskeletal   Abdominal (+) + obese,   Peds  Hematology   Anesthesia Other Findings   Reproductive/Obstetrics (+) Pregnancy                            Anesthesia Physical Anesthesia Plan  ASA: III  Anesthesia Plan: Spinal   Post-op Pain Management:    Induction:   PONV Risk Score and Plan:   Airway Management Planned:   Additional Equipment: None  Intra-op Plan:   Post-operative Plan:   Informed Consent: I have reviewed the patients History and Physical, chart, labs and discussed the procedure including the risks, benefits and alternatives for the proposed anesthesia with the patient or authorized representative who has indicated his/her understanding and acceptance.     Dental advisory given  Plan Discussed with: CRNA  Anesthesia Plan Comments: (Lab Results      Component                Value               Date                      WBC                      7.3                 06/22/2018                HGB                      12.4                06/22/2018                HCT                      37.6                06/22/2018                MCV                      91.7                06/22/2018                PLT                      211                  06/22/2018  COVID-19 Labs  No results for input(s): DDIMER, FERRITIN, LDH, CRP in the last 72 hours.  Lab Results      Component                Value               Date                      SARSCOV2NAA              NOT DETECTED        06/20/2018                SARSCOV2NAA              NEGATIVE            05/31/2018            )       Anesthesia Quick Evaluation

## 2018-06-22 NOTE — Transfer of Care (Signed)
Immediate Anesthesia Transfer of Care Note  Patient: Veronica Adams  Procedure(s) Performed: CESAREAN SECTION WITH BILATERAL TUBAL LIGATION (N/A Abdomen)  Patient Location: PACU  Anesthesia Type:Spinal  Level of Consciousness: awake, alert  and oriented  Airway & Oxygen Therapy: Patient Spontanous Breathing and Patient connected to nasal cannula oxygen  Post-op Assessment: Report given to RN and Post -op Vital signs reviewed and stable  Post vital signs: Reviewed and stable  Last Vitals:  Vitals Value Taken Time  BP    Temp    Pulse    Resp    SpO2      Last Pain:  Vitals:   06/22/18 1130  TempSrc: Oral  PainSc:       Patients Stated Pain Goal: 5 (06/22/18 0746)  Complications: No apparent anesthesia complications

## 2018-06-22 NOTE — Consult Note (Addendum)
Neonatology Consult to Antenatal Patient:  I was asked by Dr. Jolayne Panther to see this patient in order to provide antenatal counseling due to oligohydramnios and chronic HTN.  Veronica Adams was admitted yesterday and is now 38 3/[redacted] weeks GA due to intermittent REDF, oligohydramnios, and chronic HTN (on Labetalol). She is a cigarette smoker.She is currently not having active labor. She is got BMZ on 5/13-14. BPP was 8/8 on 6/3. AFI was low, but EFW was > 10th percentile. Her previous child was born at full term. This infant is female.  I spoke with the patient and the baby's father. We discussed the worst case of delivery in the next 1-2 days, including usual DR management, possible respiratory complications and need for support, IV access, feedings (mother desires formula feeding), LOS, Mortality and Morbidity, and long term outcomes. They did not have any questions at this time. I would be glad to come back if she has more questions later.  Thank you for asking me to see this patient.  Veronica Sou, MD Neonatologist  The total length of face-to-face or floor/unit time for this encounter was 20 minutes. Counseling and/or coordination of care was 15 minutes of the above.

## 2018-06-22 NOTE — Progress Notes (Signed)
Patient remains without complaints and reports good fetal movement. Reviewed ultrasound reports and discussed recommendation for delivery in view of persistently abnormal fetal testing. Discussed delivery via repeat cesarean section. Risks, benefits and alternatives were explained including but not limited to risks of bleeding, infection and damage to adjacent organs. Patient verbalized understanding and all questions were answered. Patient also desires permanent sterilization. Risks and benefits of procedure discussed with patient including permanence of method, bleeding, infection, injury to surrounding organs and need for additional procedures. Risk failure of 0.5-1% with increased risk of ectopic gestation if pregnancy occurs was also discussed with patient. Patient verbalized understanding and all questions were answered. Patient scheduled for cesarean section with bilateral tubal ligation. OR charge nurse contacted. Patient to remain NPO.

## 2018-06-22 NOTE — Progress Notes (Signed)
FACULTY PRACTICE ANTEPARTUM(COMPREHENSIVE) NOTE  Veronica Adams is a 38 y.o. [id number] with Estimated Date of Delivery: 08/07/18   By   [redacted]w[redacted]d  who is admitted for AEDF/intermittent reverse flow.    Fetal presentation is cephalic. Length of Stay:  2  Days  Date of admission:06/20/2018  Subjective:  Patient reports the fetal movement as active. Patient reports uterine contraction  activity as none. Patient reports  vaginal bleeding as none. Patient describes fluid per vagina as None.  Vitals:  Blood pressure 137/89, pulse 66, temperature 98.4 F (36.9 C), temperature source Oral, resp. rate 18, height 5\' 5"  (1.651 m), weight (!) 138.3 kg, last menstrual period 10/31/2017, SpO2 100 %. Vitals:   06/21/18 1957 06/21/18 2333 06/22/18 0411 06/22/18 0745  BP: (!) 162/90 (!) 149/88 135/81 137/89  Pulse: 73 66 69 66  Resp:  18 18 18   Temp:  98.1 F (36.7 C) 98.3 F (36.8 C) 98.4 F (36.9 C)  TempSrc:   Oral Oral  SpO2:   100% 100%  Weight:      Height:       Physical Examination:  General appearance - alert, well appearing, and in no distress Abdomen - benign Fundal Height:  size equals dates Pelvic Exam:  examination not indicated Cervical Exam:  and found to be / / and fetal presentation is . Extremities: extremities normal, atraumatic, no cyanosis or edema with DTRs 2+ bilaterally Membranes:intact  Fetal Monitoring:  130s reactive no decels     Labs:  Results for orders placed or performed during the hospital encounter of 06/20/18 (from the past 24 hour(s))  Hepatitis B surface antigen   Collection Time: 06/21/18  4:52 PM  Result Value Ref Range   Hepatitis B Surface Ag Negative Negative  Rubella screen   Collection Time: 06/21/18  4:52 PM  Result Value Ref Range   Rubella 1.07 Immune >0.99 index    Imaging Studies:     Currently EPIC will not allow sonographic studies to automatically populate into notes.  In the meantime, copy and paste results into note or free  text.  Medications:  Scheduled . docusate sodium  100 mg Oral Daily  . labetalol  800 mg Oral TID  . NIFEdipine  30 mg Oral Daily  . prenatal multivitamin  1 tablet Oral Q1200   I have reviewed the patient's current medications.  ASSESSMENT: [id number] [redacted]w[redacted]d Estimated Date of Delivery: 08/07/18  Patient Active Problem List   Diagnosis Date Noted  . Oligohydramnios with antenatal problem 06/20/2018  . Abnormal fetal ultrasound 06/01/2018  . Obesity in pregnancy 05/21/2018  . BMI 40.0-44.9, adult (HCC) 05/21/2018  . History of intrauterine growth restriction in prior pregnancy, currently pregnant 05/21/2018  . AMA (advanced maternal age) multigravida 35+ 04/12/2018  . Supervision of high risk pregnancy, antepartum 04/11/2018  . Previous cesarean delivery affecting pregnancy, antepartum 04/11/2018  . Tobacco use affecting pregnancy, antepartum 04/11/2018  . Chronic hypertension affecting pregnancy 01/15/2015  . Left breast abscess 01/15/2015    PLAN: >S/P betamethasone 5/13, 5/14 >awaiting follow up Doppler studeis and fluid status to make decision regarding delivery timing, scheduled for 6/5 >otherwise twice daily monitoring and in house observation with BP management stable  Lazaro Arms

## 2018-06-22 NOTE — Progress Notes (Signed)
Patient off the monitor for ultrasound. 

## 2018-06-23 ENCOUNTER — Encounter (HOSPITAL_COMMUNITY): Payer: Self-pay | Admitting: *Deleted

## 2018-06-23 LAB — CBC
HCT: 38.8 % (ref 36.0–46.0)
Hemoglobin: 12.7 g/dL (ref 12.0–15.0)
MCH: 30.4 pg (ref 26.0–34.0)
MCHC: 32.7 g/dL (ref 30.0–36.0)
MCV: 92.8 fL (ref 80.0–100.0)
Platelets: 180 10*3/uL (ref 150–400)
RBC: 4.18 MIL/uL (ref 3.87–5.11)
RDW: 14 % (ref 11.5–15.5)
WBC: 7.3 10*3/uL (ref 4.0–10.5)
nRBC: 0 % (ref 0.0–0.2)

## 2018-06-23 LAB — CULTURE, BETA STREP (GROUP B ONLY)

## 2018-06-23 MED ORDER — ENOXAPARIN SODIUM 80 MG/0.8ML ~~LOC~~ SOLN
70.0000 mg | SUBCUTANEOUS | Status: DC
  Administered 2018-06-23: 70 mg via SUBCUTANEOUS
  Filled 2018-06-23: qty 0.8

## 2018-06-23 NOTE — Progress Notes (Signed)
Attempted to get pt out of bed, pt states "I feel drowsy" I'll try again.

## 2018-06-23 NOTE — Progress Notes (Signed)
Daily Postpartum Note  Admission Date: 06/20/2018 Current Date: 06/23/2018 7:43 AM  Fernand Parkinsicole L Blash is a 38 y.o. [id number], HD#4/POD#1 s/p pLTCS and BTL # 33wks for FGR, abnormal dopplers, admitted for fetal surveillance.  Pregnancy complicated by: Patient Active Problem List   Diagnosis Date Noted  . Pregnancy affected by fetal growth restriction 06/22/2018  . Oligohydramnios with antenatal problem 06/20/2018  . Abnormal fetal ultrasound 06/01/2018  . Obesity in pregnancy 05/21/2018  . BMI 40.0-44.9, adult ([facility]) 05/21/2018  . History of intrauterine growth restriction in prior pregnancy, currently pregnant 05/21/2018  . AMA (advanced maternal age) multigravida 35+ 04/12/2018  . Supervision of high risk pregnancy, antepartum 04/11/2018  . Previous cesarean delivery affecting pregnancy, antepartum 04/11/2018  . Tobacco use affecting pregnancy, antepartum 04/11/2018  . Chronic hypertension affecting pregnancy 01/15/2015  . Left breast abscess 01/15/2015    Overnight/24hr events:  none  Subjective:  Foley still. No problems with PO or pain, no n/v or flatus  Objective:    Current Vital Signs 24h Vital Sign Ranges  T 98 F (36.7 C) Temp  Avg: 98.1 F (36.7 C)  Min: 97.8 F (36.6 C)  Max: 98.5 F (36.9 C)  BP 120/77 BP  Min: 120/77  Max: 177/95  HR 62 Pulse  Avg: 63.6  Min: 46  Max: 87  RR 20 Resp  Avg: 17.2  Min: 11  Max: 20  SaO2 93 % Room Air SpO2  Avg: 96.1 %  Min: 93 %  Max: 100 %       24 Hour I/O Current Shift I/O  Time Ins Outs 06/05 0701 - 06/06 0700 In: 1869.6 [I.V.:1869.6] Out: 1150 [Urine:550] 06/06 0701 - 06/06 1900 In: -  Out: 450 [Urine:450]   Patient Vitals for the past 24 hrs:  BP Temp Temp src Pulse Resp SpO2  06/23/18 0700 - - - - - 93 %  06/23/18 0645 - - - - - 93 %  06/23/18 0630 - - - - - 98 %  06/23/18 0615 - - - - - 98 %  06/23/18 0559 - - - - - 99 %  06/23/18 0545 - - - - - 99 %  06/23/18 0530 - - - - - 97 %  06/23/18 0515 - - - - - 95 %   06/23/18 0500 - - - - - 96 %  06/23/18 0445 - - - - - 96 %  06/23/18 0430 - - - - - 97 %  06/23/18 0415 - 98 F (36.7 C) Oral - 20 94 %  06/23/18 0350 - - - - - 95 %  06/23/18 0330 - - - - - 95 %  06/23/18 0300 - - - - - 94 %  06/23/18 0230 - - - - - 95 %  06/23/18 0200 - - - - - 96 %  06/23/18 0130 - - - - - 94 %  06/23/18 0100 - - - - - 94 %  06/23/18 0030 - - - - - 94 %  06/23/18 0015 - - - - - 96 %  06/23/18 0010 120/77 98 F (36.7 C) Oral 62 20 100 %  06/23/18 0005 - - - - - 98 %  06/23/18 0000 - - - - - 95 %  06/22/18 2330 - - - - - 96 %  06/22/18 2300 - - - - - 95 %  06/22/18 2230 - - - - - 95 %  06/22/18 2200 - - - - - 95 %  06/22/18 2130 - - - - - 96 %  06/22/18 2115 - - - - - 96 %  06/22/18 2112 (!) 123/58 98.5 F (36.9 C) Oral 65 20 -  06/22/18 2030 134/73 - - 74 - 93 %  06/22/18 2020 - - - - - 95 %  06/22/18 2019 (!) 144/82 98.4 F (36.9 C) Oral 78 20 -  06/22/18 2015 - - - - - 95 %  06/22/18 2000 (!) 138/109 - - (!) 46 - 97 %  06/22/18 1955 - - - - - 95 %  06/22/18 1946 (!) 139/108 - - 87 - -  06/22/18 1935 - - - - - 97 %  06/22/18 1934 (!) 164/97 - - 66 - -  06/22/18 1931 - - - - - 96 %  06/22/18 1921 (!) 165/100 98 F (36.7 C) Oral 67 17 -  06/22/18 1813 (!) 177/95 98.1 F (36.7 C) Oral 62 18 98 %  06/22/18 1741 (!) 164/100 - - 60 15 100 %  06/22/18 1730 (!) 166/96 - - (!) 58 19 94 %  06/22/18 1715 (!) 158/103 - - (!) 57 15 96 %  06/22/18 1700 (!) 162/94 - - (!) 52 13 96 %  06/22/18 1645 (!) 150/120 - - (!) 58 17 100 %  06/22/18 1639 (!) 154/93 97.8 F (36.6 C) Oral (!) 57 11 95 %  06/22/18 1130 (!) 144/92 97.8 F (36.6 C) Oral 67 18 98 %  06/22/18 0745 137/89 98.4 F (36.9 C) Oral 66 18 100 %    Physical exam: General: Well nourished, well developed female in no acute distress. Abdomen: obese, rare BS, c/d/i Cardiovascular: S1, S2 normal, no murmur, rub or gallop, regular rate and rhythm Respiratory: CTAB Extremities: no clubbing, cyanosis  or edema Skin: Warm and dry.  GU: clear UOP in foley  Medications: Current Facility-Administered Medications  Medication Dose Route Frequency Provider Last Rate Last Dose  . coconut oil  1 application Topical PRN Constant, Peggy, MD      . witch hazel-glycerin (TUCKS) pad 1 application  1 application Topical PRN Constant, Peggy, MD       And  . dibucaine (NUPERCAINAL) 1 % rectal ointment 1 application  1 application Rectal PRN Constant, Peggy, MD      . diphenhydrAMINE (BENADRYL) injection 12.5 mg  12.5 mg Intravenous Q4H PRN Effie Berkshire, MD   12.5 mg at 06/23/18 0518   Or  . diphenhydrAMINE (BENADRYL) capsule 25 mg  25 mg Oral Q4H PRN Effie Berkshire, MD      . diphenhydrAMINE (BENADRYL) capsule 25 mg  25 mg Oral Q6H PRN Constant, Peggy, MD      . enoxaparin (LOVENOX) injection 70 mg  70 mg Subcutaneous Q24H Aletha Halim, MD      . gabapentin (NEURONTIN) capsule 100 mg  100 mg Oral TID Constant, Peggy, MD   100 mg at 06/22/18 2112  . hydrALAZINE (APRESOLINE) injection 5 mg  5 mg Intravenous PRN Constant, Peggy, MD   5 mg at 06/21/18 0901   And  . hydrALAZINE (APRESOLINE) injection 10 mg  10 mg Intravenous PRN Constant, Peggy, MD   10 mg at 06/22/18 1948   And  . labetalol (NORMODYNE) injection 20 mg  20 mg Intravenous PRN Constant, Peggy, MD       And  . labetalol (NORMODYNE) injection 40 mg  40 mg Intravenous PRN Constant, Peggy, MD      . hydrochlorothiazide (HYDRODIURIL) tablet 25 mg  25 mg Oral Daily Constant, Peggy, MD   25 mg at 06/22/18 1838  . ibuprofen (ADVIL) tablet 800 mg  800 mg Oral Q8H Constant, Peggy, MD   800 mg at 06/23/18 0006  . ketorolac (TORADOL) 30 MG/ML injection 30 mg  30 mg Intravenous Q6H PRN Shelton SilvasHollis, Kevin D, MD   30 mg at 06/22/18 1724   Or  . ketorolac (TORADOL) 30 MG/ML injection 30 mg  30 mg Intramuscular Q6H PRN Shelton SilvasHollis, Kevin D, MD      . lactated ringers infusion   Intravenous Continuous Constant, Peggy, MD 125 mL/hr at 06/23/18 0400    .  lisinopril (ZESTRIL) tablet 20 mg  20 mg Oral Daily Constant, Peggy, MD   20 mg at 06/22/18 1838  . menthol-cetylpyridinium (CEPACOL) lozenge 3 mg  1 lozenge Oral Q2H PRN Constant, Peggy, MD      . nalbuphine (NUBAIN) injection 5 mg  5 mg Intravenous Q4H PRN Shelton SilvasHollis, Kevin D, MD   5 mg at 06/23/18 0002   Or  . nalbuphine (NUBAIN) injection 5 mg  5 mg Subcutaneous Q4H PRN Shelton SilvasHollis, Kevin D, MD      . nalbuphine (NUBAIN) injection 5 mg  5 mg Intravenous Once PRN Shelton SilvasHollis, Kevin D, MD       Or  . nalbuphine (NUBAIN) injection 5 mg  5 mg Subcutaneous Once PRN Shelton SilvasHollis, Kevin D, MD      . naloxone [facility](NARCAN) injection 0.4 mg  0.4 mg Intravenous PRN Shelton SilvasHollis, Kevin D, MD       And  . sodium chloride flush (NS) 0.9 % injection 3 mL  3 mL Intravenous PRN Shelton SilvasHollis, Kevin D, MD      . naloxone HCl (NARCAN) 2 mg in dextrose 5 % 250 mL infusion  1-4 mcg/kg/hr Intravenous Continuous PRN Shelton SilvasHollis, Kevin D, MD      . ondansetron [facility](ZOFRAN) injection 4 mg  4 mg Intravenous Q8H PRN Shelton SilvasHollis, Kevin D, MD      . oxyCODONE (Oxy IR/ROXICODONE) immediate release tablet 5-10 mg  5-10 mg Oral Q4H PRN Constant, Peggy, MD      . oxytocin (PITOCIN) IV infusion 40 units in NS 1000 mL - Premix  2.5 Units/hr Intravenous Continuous Constant, Peggy, MD   Stopped at 06/23/18 0400  . prenatal multivitamin tablet 1 tablet  1 tablet Oral Q1200 Constant, Peggy, MD      . scopolamine (TRANSDERM-SCOP) 1 MG/3DAYS 1.5 mg  1 patch Transdermal Once Shelton SilvasHollis, Kevin D, MD   1.5 mg at 06/22/18 1726  . senna-docusate (Senokot-S) tablet 2 tablet  2 tablet Oral Q24H Constant, Peggy, MD   2 tablet at 06/23/18 0002  . simethicone (MYLICON) chewable tablet 80 mg  80 mg Oral TID PC Constant, Peggy, MD   80 mg at 06/22/18 1838  . simethicone (MYLICON) chewable tablet 80 mg  80 mg Oral Q24H Constant, Peggy, MD   80 mg at 06/23/18 0002  . simethicone (MYLICON) chewable tablet 80 mg  80 mg Oral PRN Constant, Peggy, MD      . Tdap (BOOSTRIX) injection 0.5 mL  0.5 mL  Intramuscular Once Constant, Peggy, MD        Labs:  Recent Labs  Lab 06/22/18 1405 06/22/18 1941 06/23/18 0628  WBC 7.3 7.9 7.3  HGB 12.4 13.0 12.7  HCT 37.6 38.5 38.8  PLT 211 204 180    Recent Labs  Lab 06/20/18 1855 06/22/18 1941  NA 137  --   K 4.7  --  CL 105  --   CO2 22  --   BUN 11  --   CREATININE 0.96 0.70  CALCIUM 9.4  --   PROT 6.0*  --   BILITOT 1.0  --   ALKPHOS 79  --   ALT 17  --   AST 25  --   GLUCOSE 77  --     Radiology: no new imaging  Assessment & Plan:  Pt doing well *PP: routine care. Breast. B POS. F/u foley d/c and void *cHTN: back on pre pregnancy meds. Had poor control at NOB. Watch closely *PPx: lovenox later today, oob ad lib *FEN/GI: regular diet, SLIV *Dispo: likely pod#3  Cornelia Copaharlie Sharin Altidor, Jr. MD Attending Center [facility](Faculty Practice)

## 2018-06-24 ENCOUNTER — Encounter (HOSPITAL_COMMUNITY): Payer: Self-pay | Admitting: *Deleted

## 2018-06-24 MED ORDER — HYDROCHLOROTHIAZIDE 25 MG PO TABS: 25.0000 mg | ORAL_TABLET | Freq: Every day | ORAL | 3 refills | Status: DC

## 2018-06-24 MED ORDER — OXYCODONE-ACETAMINOPHEN 5-325 MG PO TABS: 1.0000 | ORAL_TABLET | ORAL | 0 refills | Status: DC | PRN

## 2018-06-24 MED ORDER — IBUPROFEN 800 MG PO TABS: 800.0000 mg | ORAL_TABLET | Freq: Three times a day (TID) | ORAL | 0 refills | Status: DC | PRN

## 2018-06-24 MED ORDER — LISINOPRIL 40 MG PO TABS
40.0000 mg | ORAL_TABLET | Freq: Every day | ORAL | Status: DC
  Administered 2018-06-24: 20 mg via ORAL
  Filled 2018-06-24: qty 1

## 2018-06-24 MED ORDER — LISINOPRIL 20 MG PO TABS: 40.0000 mg | ORAL_TABLET | Freq: Every day | ORAL | 3 refills | Status: DC

## 2018-06-24 NOTE — Discharge Instructions (Signed)

## 2018-07-04 ENCOUNTER — Telehealth: Payer: Self-pay | Admitting: Family Medicine

## 2018-07-04 NOTE — Telephone Encounter (Signed)
Attempted to call patient about her appointment on 6/19 @ 9:20. No answer left detailed voicemail instructing the patient to wear a face mask for the entire appointment and no visitors are allowed. Patient instructed that if she has any symptoms to not come to the appointment and give the office a call to be rescheduled. Office number and list of symptoms were left.

## 2018-07-06 ENCOUNTER — Other Ambulatory Visit: Payer: Self-pay

## 2018-07-06 ENCOUNTER — Ambulatory Visit (INDEPENDENT_AMBULATORY_CARE_PROVIDER_SITE_OTHER): Payer: Medicaid Other | Admitting: Emergency Medicine

## 2018-07-06 VITALS — BP 172/114 | Wt 287.1 lb

## 2018-07-06 DIAGNOSIS — O10919 Unspecified pre-existing hypertension complicating pregnancy, unspecified trimester: Secondary | ICD-10-CM

## 2018-07-06 DIAGNOSIS — Z98891 History of uterine scar from previous surgery: Secondary | ICD-10-CM

## 2018-07-06 MED ORDER — LISINOPRIL 20 MG PO TABS: 40.0000 mg | ORAL_TABLET | Freq: Two times a day (BID) | ORAL | 3 refills | Status: DC

## 2018-07-06 MED ORDER — HYDROCHLOROTHIAZIDE 25 MG PO TABS: 25.0000 mg | ORAL_TABLET | Freq: Two times a day (BID) | ORAL | 3 refills | Status: DC

## 2018-07-06 NOTE — Progress Notes (Signed)
Pt came in today for a blood pressure check and incision check s/p c-section on 6/5. Incision was assessed and there was no drainage, swelling, or redness. Pt denied having pain. Dr. Harolyn Rutherford used silver nitrate to seal 3 areas measuring approximately 2 mm. Manual blood pressure was taken and it was 172/114. Pt denied having headaches, swelling, dizziness, or changes in vision. Dr. Harolyn Rutherford modified pt current medication regimen to 40 mg lisinopril BID and hydrochlorothiazide 20 mg BID. Pt was encouraged to take her blood pressure daily and to report any new symptoms experienced. Pt verbalized understanding and had no further questions.

## 2018-07-06 NOTE — Progress Notes (Signed)
I have reviewed the chart and agree with nursing staff's documentation of this patient's encounter.  Verita Schneiders, MD 07/06/2018 12:29 PM

## 2018-07-13 ENCOUNTER — Other Ambulatory Visit: Payer: Self-pay

## 2018-07-13 ENCOUNTER — Ambulatory Visit (INDEPENDENT_AMBULATORY_CARE_PROVIDER_SITE_OTHER): Payer: Medicaid Other

## 2018-07-13 DIAGNOSIS — Z5189 Encounter for other specified aftercare: Secondary | ICD-10-CM

## 2018-07-13 NOTE — Progress Notes (Signed)
I have reviewed the chart and agree with nursing staff's documentation of this patient's encounter.  Verita Schneiders, MD 07/13/2018 12:35 PM

## 2018-07-13 NOTE — Progress Notes (Signed)
Pt here today for wound check and BP check.  Pt incision well approximated with small area about 51mm in length, no pain, no drainage, and no redness.  BP LA 152/103 rpt BP RA 153/101.  Notified Dr. Harolyn Rutherford who recommends that pt continue to her BP regimen of Lisinopril 40 mg po bid, HCTZ 25 mg po bid,  and to continue to monitor for sx's of HTN.  Pt has a pp visit on 07/23/18.  Pt verbalized understanding.   Mel Almond, RN 07/13/18

## 2018-07-19 ENCOUNTER — Telehealth: Payer: Self-pay | Admitting: Obstetrics and Gynecology

## 2018-07-19 NOTE — Telephone Encounter (Signed)
Called the patient to confirm the mychart visit scheduled for tomorrow. Informed the patient of being signed into the virtual visit 15 minutes prior to time. The patient verbalized understanding.   The patient stated she cant download mychart. Informed the patient of calling our office Monday morning if she continues to have issues and we will change the visit to a [facility] webex visit. Meanwhile, the patient was given the number to [employer] for further assistance.

## 2018-07-20 ENCOUNTER — Other Ambulatory Visit: Payer: Self-pay | Admitting: Obstetrics & Gynecology

## 2018-07-20 DIAGNOSIS — O10919 Unspecified pre-existing hypertension complicating pregnancy, unspecified trimester: Secondary | ICD-10-CM

## 2018-07-23 ENCOUNTER — Other Ambulatory Visit: Payer: Self-pay

## 2018-07-23 ENCOUNTER — Telehealth (INDEPENDENT_AMBULATORY_CARE_PROVIDER_SITE_OTHER): Payer: Medicaid Other | Admitting: Obstetrics & Gynecology

## 2018-07-23 DIAGNOSIS — Z98891 History of uterine scar from previous surgery: Secondary | ICD-10-CM

## 2018-07-23 NOTE — Patient Instructions (Signed)

## 2018-07-23 NOTE — Progress Notes (Signed)
LM for pt @ 1529 that I am calling in regards to her appt scheduled at 1535 if she could please give the office a call back.  I will call back 15 minutes after 1535 in which if she is not available we will have to request that we reschedule her appt.  I connected with  Veronica Adams on 07/23/18 at  3:35 PM EDT by telephone and verified that I am speaking with the correct person using two identifiers.   I discussed the limitations, risks, security and privacy concerns of performing an evaluation and management service by telephone and the availability of in person appointments. I also discussed with the patient that there may be a patient responsible charge related to this service. The patient expressed understanding and agreed to proceed.     TELEHEALTH POSTPARTUM VIRTUAL VIDEO VISIT ENCOUNTER NOTE   Provider location: Center for [employer] at [facility]   I connected with Veronica Adams on 07/23/18 at  3:35 PM EDT by tele[phone Encounter at home and verified that I am speaking with the correct person using two identifiers.    I discussed the limitations, risks, security and privacy concerns of performing an evaluation and management service virtually and the availability of in person appointments. I also discussed with the patient that there may be a patient responsible charge related to this service. The patient expressed understanding and agreed to proceed.  Chief Complaint: Postpartum Visit  History of Present Illness: Veronica Adams is a 38 y.o. African-American [id number] being evaluated for postpartum followup.    She is s/p repeat cesarean sectiofor intermittent reverse fetal Doppler flow with CHTN, FGR and oligohydramnios.      Subjective:n on 6/5 at 33.4 weeks; she was discharged to home on 2D#2. Pregnancy complicated by . Baby is doing well.  Complains of no pain  Vaginal bleeding or discharge: No  Intercourse: Yes  Contraception: bilateral tubal ligation  Mode of feeding infant: Bottle PP depression s/s: No .  Any bowel or bladder issues: No  Pap smear: no abnormalities (date: 04/02/2018)  Review of Systems: Positive for no bleeding. Her 12 point review of systems is negative or as noted in the History of Present Illness.  Patient Active Problem List   Diagnosis Date Noted  . Obesity in pregnancy 05/21/2018  . BMI 40.0-44.9, adult ([place]) 05/21/2018  . Supervision of high risk pregnancy, antepartum 04/11/2018  . S/P cesarean section 04/11/2018  . Chronic hypertension affecting pregnancy 01/15/2015  . Left breast abscess 01/15/2015    Medications Veronica Adams had no medications administered during this visit. Current Outpatient Medications  Medication Sig Dispense Refill  . aspirin EC 81 MG tablet Take 81 mg by mouth daily.    . hydrochlorothiazide (HYDRODIURIL) 25 MG tablet TAKE 1 TABLET BY MOUTH TWICE A DAY 180 tablet 1  . ibuprofen (ADVIL) 800 MG tablet Take 1 tablet (800 mg total) by mouth every 8 (eight) hours as needed. 30 tablet 0  . lisinopril (ZESTRIL) 20 MG tablet TAKE 2 TABLETS (40 MG TOTAL) BY MOUTH 2 (TWO) TIMES A DAY. 360 tablet 1  . oxyCODONE-acetaminophen (PERCOCET) 5-325 MG tablet Take 1 tablet by mouth every 4 (four) hours as needed for severe pain. (Patient not taking: Reported on 07/06/2018) 40 tablet 0  . prenatal vitamin w/FE, FA (PRENATAL 1 + 1) 27-1 MG TABS tablet Take 1 tablet by mouth daily at 12 noon.     No current facility-administered medications for this visit.  Allergies Patient has no known allergies.  Physical Exam:  There were no vitals taken for this visit. Reviewed from Bab[employer]s General:  Alert, oriented and cooperative. Patient is in no acute distress.  Mental Status: Normal mood and affect. Normal behavior. Normal judgment and thought content.   Respiratory: Normal respiratory effort noted, no problems with respiration noted  Rest of physical exam deferred due to type of encounter   PP Depression Screening:   Edinburgh Postnatal Depression Scale Screening Tool 06/24/2018  I have been able to laugh and see the funny side of things. 0  I have looked forward with enjoyment to things. 0  I have blamed myself unnecessarily when things went wrong. 0  I have been anxious or worried for no good reason. 1  I have felt scared or panicky for no good reason. 0  Things have been getting on top of me. 1  I have been so unhappy that I have had difficulty sleeping. 0  I have felt sad or miserable. 1  I have been so unhappy that I have been crying. 0  The thought of harming myself has occurred to me. 0  Edinburgh Postnatal Depression Scale Total 3     Assessment:Patient is a 38 y.o. [id number] who is 5 weeks postpartum from a primary cesarean section.  She is doing well.   Plan: S/p BTL, routine Gyn care There are no diagnoses linked to this encounter. F/U with PCP for HTN care   I discussed the assessment and treatment plan with the patient. The patient was provided an opportunity to ask questions and all were answered. The patient agreed with the plan and demonstrated an understanding of the instructions.   The patient was advised to call back or seek an in-person evaluation/go to the ED for any concerning postpartum symptoms.  I provided 10 minutes of face-to-face time during this encounter.   Adam PhenixArnold, James G, MD 07/23/2018

## 2019-06-13 ENCOUNTER — Ambulatory Visit: Payer: Medicaid Other | Admitting: Obstetrics & Gynecology

## 2019-07-18 DIAGNOSIS — Z419 Encounter for procedure for purposes other than remedying health state, unspecified: Secondary | ICD-10-CM | POA: Diagnosis not present

## 2019-08-08 IMAGING — US US MFM UA CORD DOPPLER
1 series · 14 of 28 positions shown · non-contrast
Comparison: none

[Series 1: us mfm ua cord doppler · 41 acquisitions, 14 frames shown]
[im 2/41]
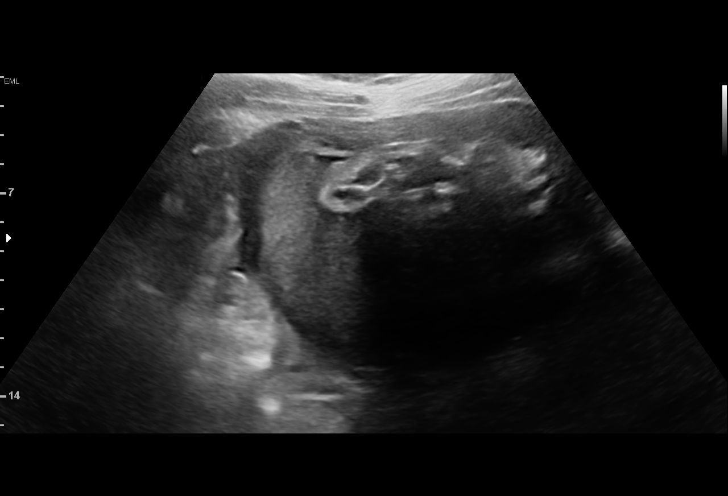
[im 5/41]
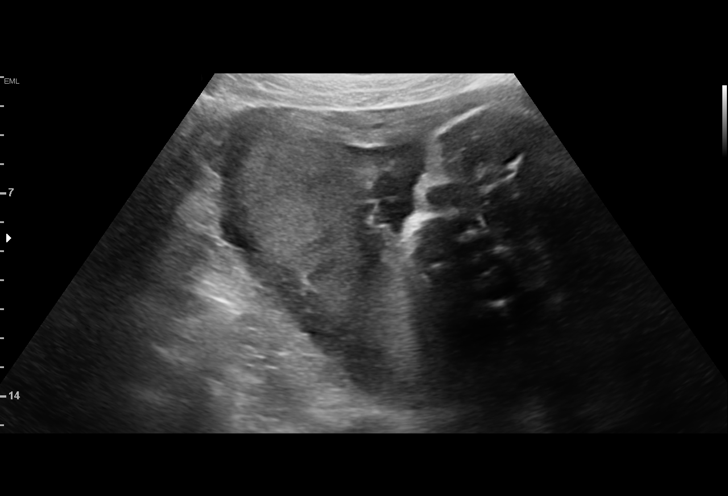
[im 8/41]
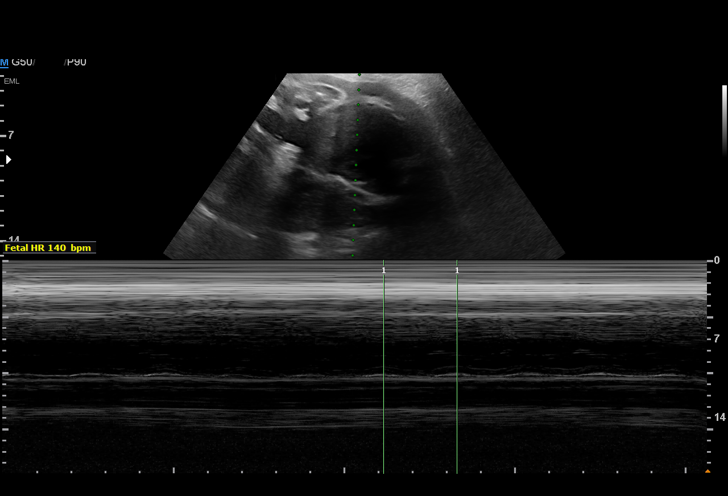
[im 11/41]
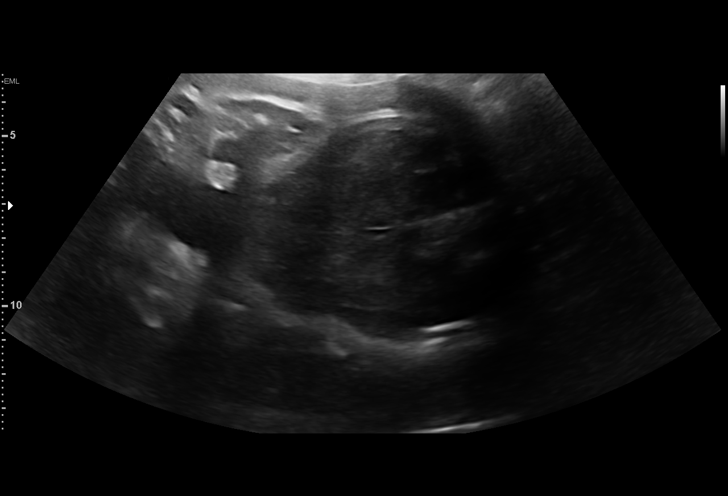
[im 14/41]
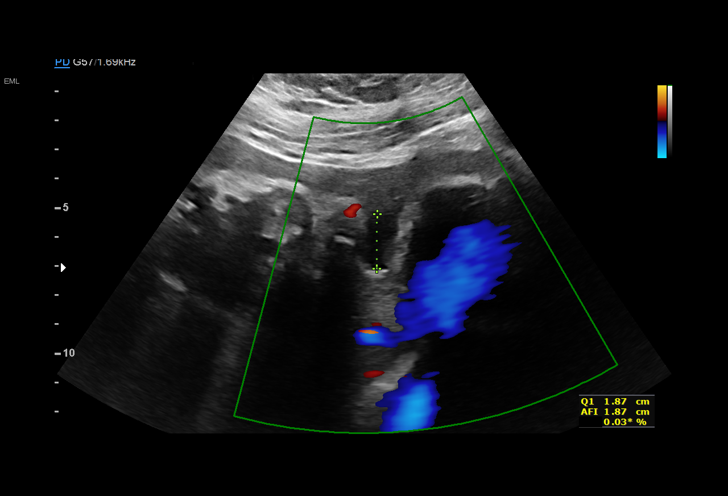
[im 17/41]
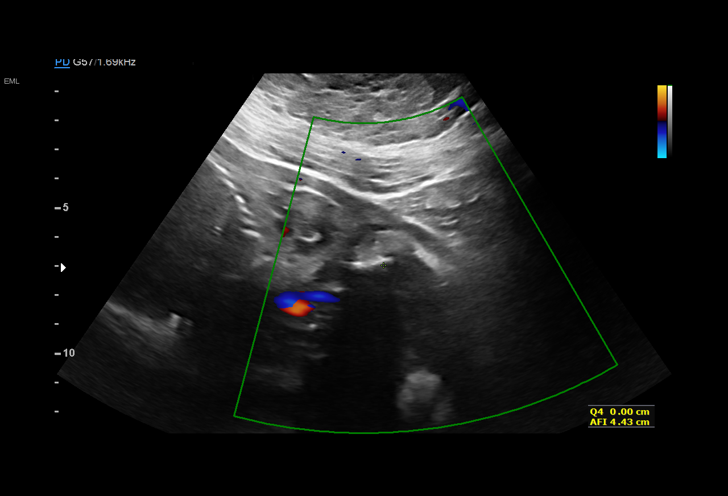
[im 20/41]
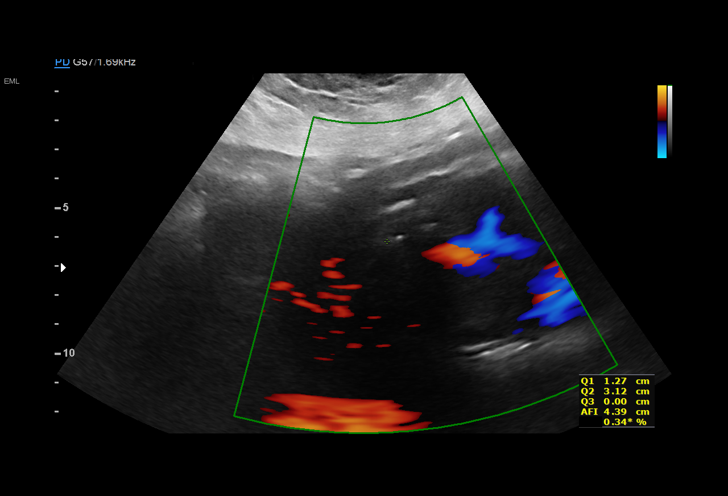
[im 23/41]
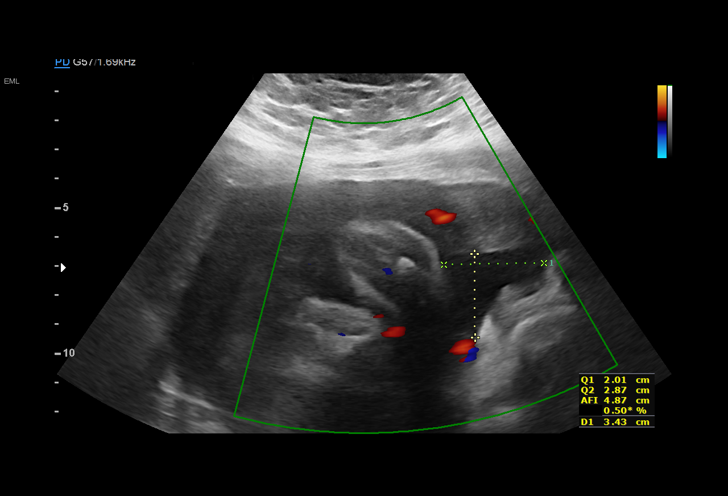
[im 26/41]
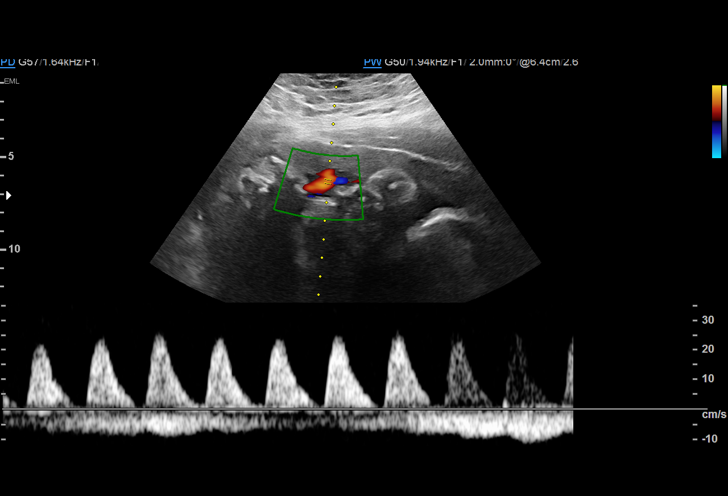
[im 29/41]
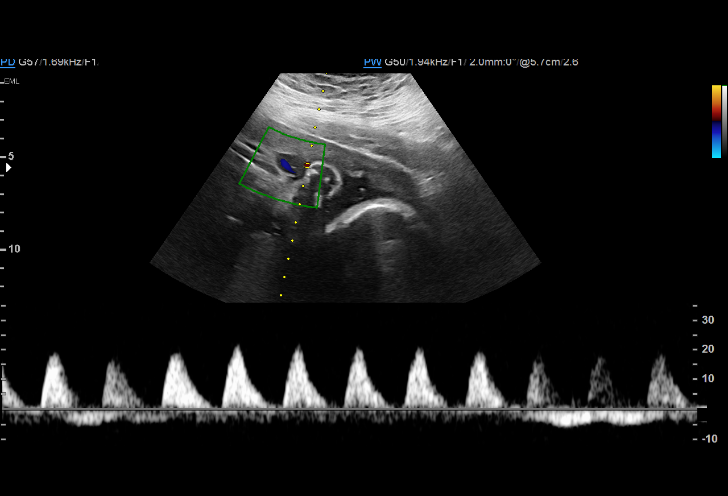
[im 32/41]
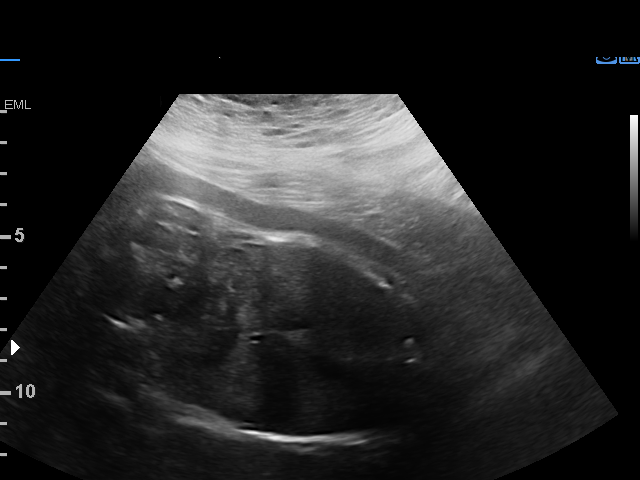
[im 35/41]
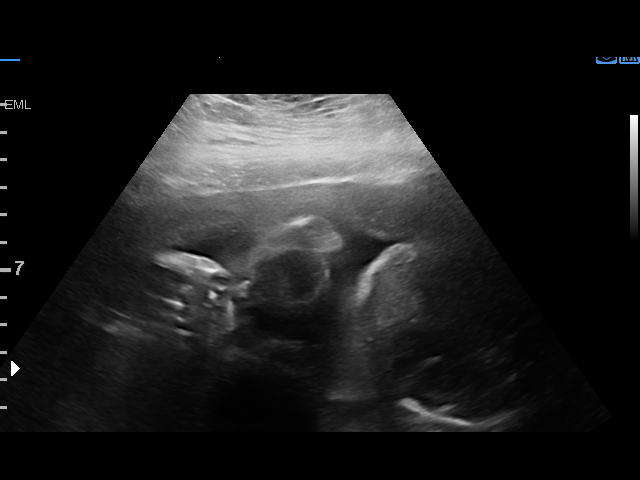
[im 38/41]
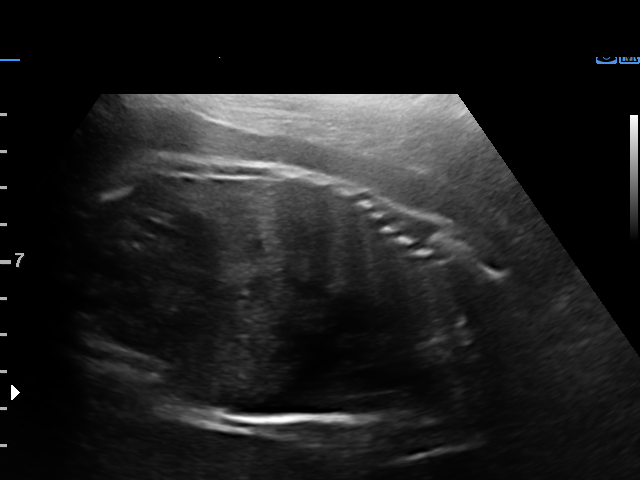
[im 41/41]
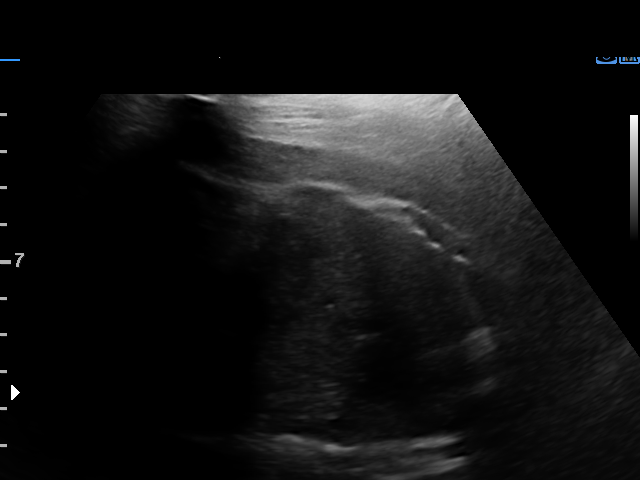

[14 of 28 positions shown; findings below may reference images not displayed]

[address]

 ----------------------------------------------------------------------

 ----------------------------------------------------------------------
Indications

  33 weeks gestation of pregnancy
  Hypertension - Chronic/Pre-existing
  (labetalol)
  Previous cesarean delivery, antepartum
  Poor obstetric history: Previous fetal growth
  restriction (FGR)
  Maternal morbid obesity
  Tobacco use complicating pregnancy, third
  trimester
  Poor obstetrical history (IUGR)
  Advanced maternal age multigravida 35+,
  third trimester
 ----------------------------------------------------------------------
Vital Signs

 BMI:
Fetal Evaluation

 Num Of Fetuses:          1
 Fetal Heart Rate(bpm):   140
 Cardiac Activity:        Observed
 Presentation:            Cephalic
 Placenta:                Posterior
 P. Cord Insertion:       Previously Visualized

 Amniotic Fluid
 AFI FV:      Oligohydramnios

 AFI Sum(cm)     %Tile       Largest Pocket(cm)
 4.88            < 3
 RUQ(cm)       RLQ(cm)       LUQ(cm)        LLQ(cm)
 2.01          0             2.87           0

 Comment:    2 x 2 cm pocket noted.
Biophysical Evaluation

 Amniotic F.V:   Within normal limits       F. Tone:         Observed
 F. Movement:    Observed                   Score:           [DATE]
 F. Breathing:   Observed
OB History

 Gravidity:    7         Term:   1        Prem:   0        SAB:   0
 TOP:          4       Ectopic:  1        Living: 1
Gestational Age

 LMP:           33w 1d        Date:  10/31/17                 EDD:   08/07/18
 Best:          33w 1d     Det. By:  LMP  (10/31/17)          EDD:   08/07/18
Anatomy

 Diaphragm:             Appears normal         Abdominal Wall:         Appears nml (cord
                                                                       insert, abd wall)
 Stomach:               Appears normal, left   Kidneys:                Appear normal
                        sided
 Abdomen:               Appears normal         Bladder:                Appears normal
Doppler - Fetal Vessels

 Umbilical Artery
                                                           ADFV    RDFV
                                                             Yes     Yes

Impression

 Oligohydramnios
 IUGR AC< 7th% EFW>10th%
 Persistent Absent EDF with intermittent reversed
 Chronic hypertension-asymptomatic.
Recommendations

 Patient sent to hospital for admission
 [place] administration if not previously given.
 Fluid hydration
 Daily NST, Repeat Dopplers on [REDACTED] with BPP, if
 oligohydramnios resolves and UA Dopplers improve to
 persistent absent EDF, consider delivery at 34 however, if
 oligohydramnios persist and intermittent REDF is present
 delivery after BMZ administration.

 Discussed plan of care with Dr. Evard.

## 2019-08-10 IMAGING — US US MFM UA CORD DOPPLER
1 series · 13 of 28 positions shown · non-contrast
Comparison: none

[Series 1: us mfm ua cord doppler · 52 acquisitions, 13 frames shown]
[im 2/52]
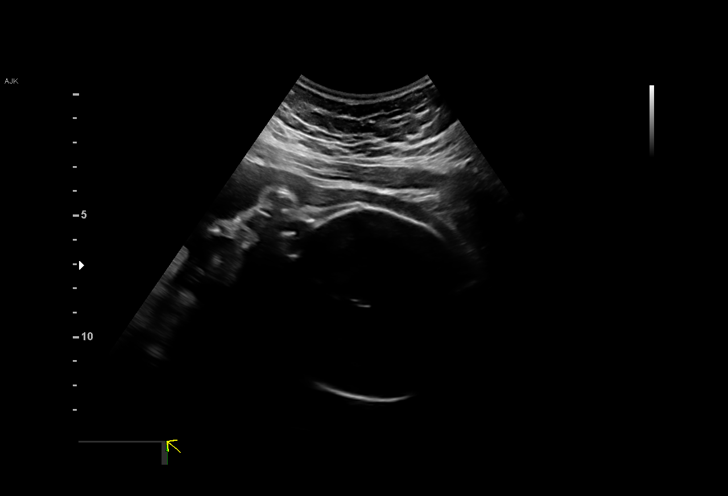
[im 6/52]
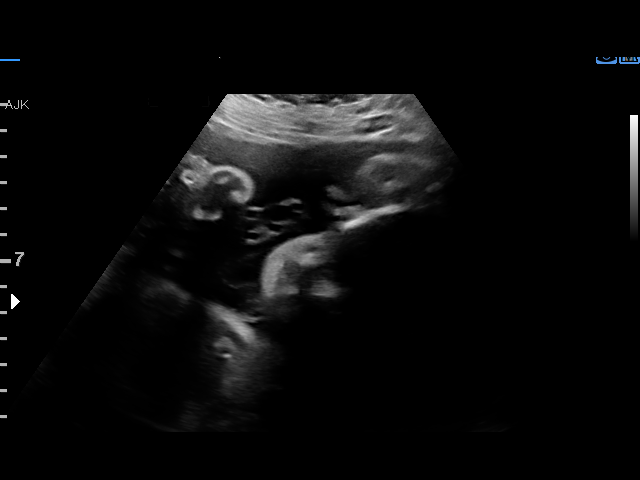
[im 10/52]
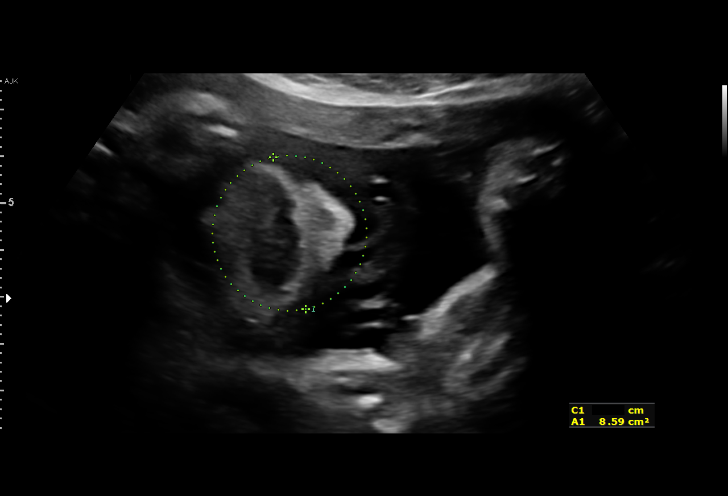
[im 14/52]
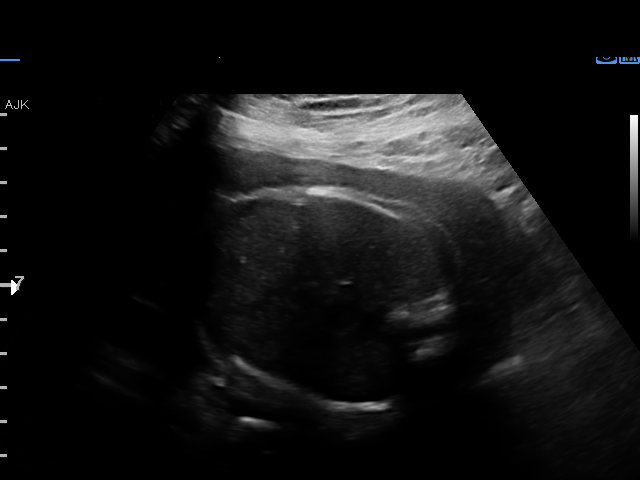
[im 18/52]
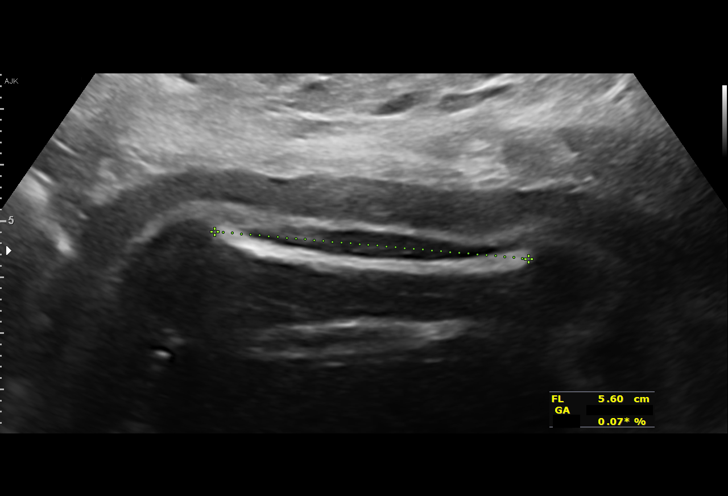
[im 21/52]
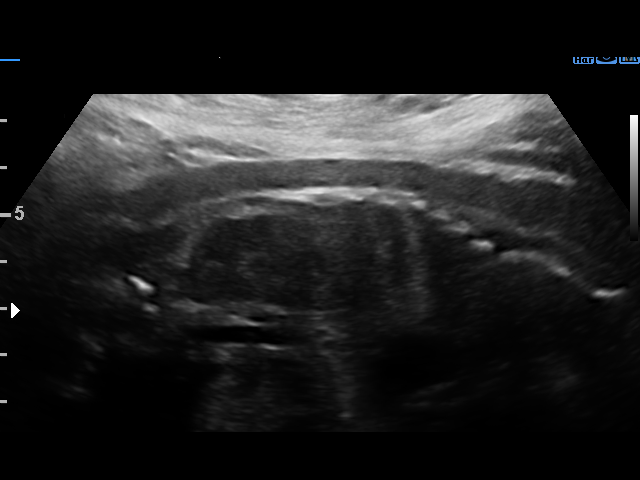
[im 27/52]
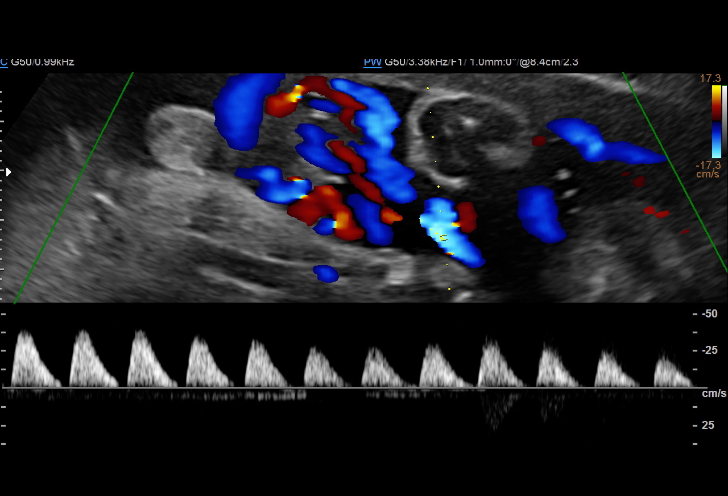
[im 31/52]
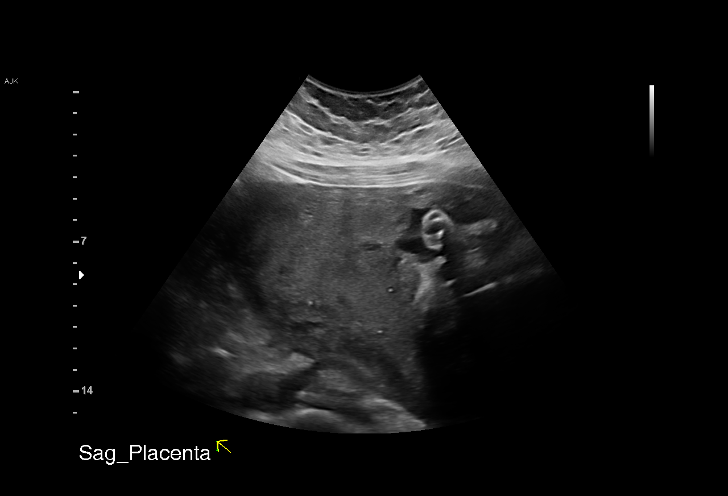
[im 35/52]
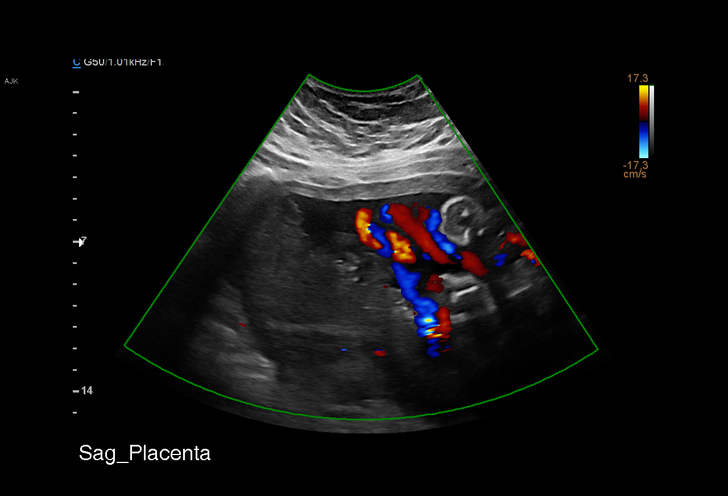
[im 38/52]
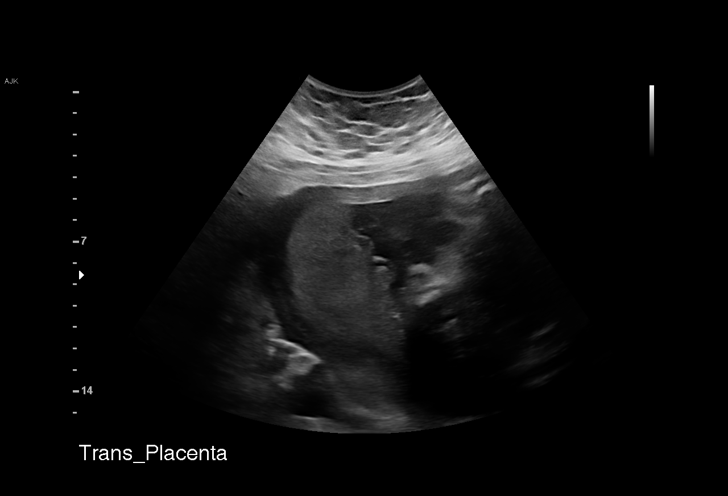
[im 42/52]
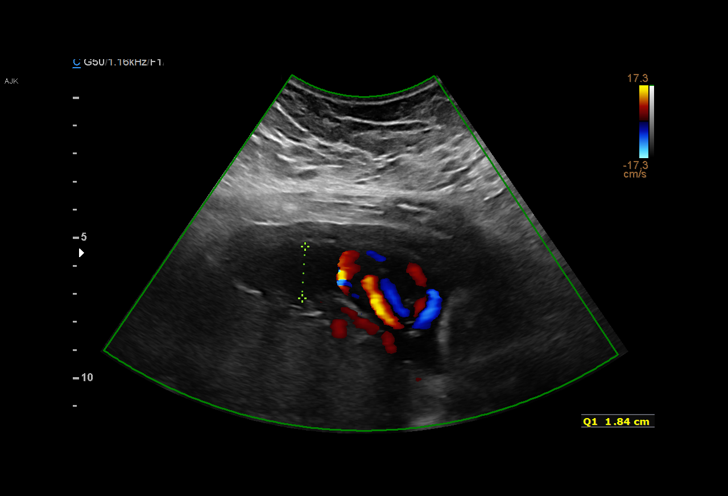
[im 46/52]
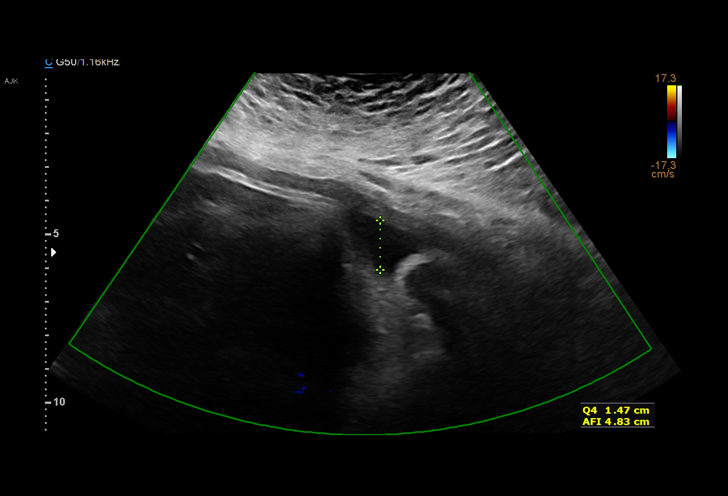
[im 50/52]
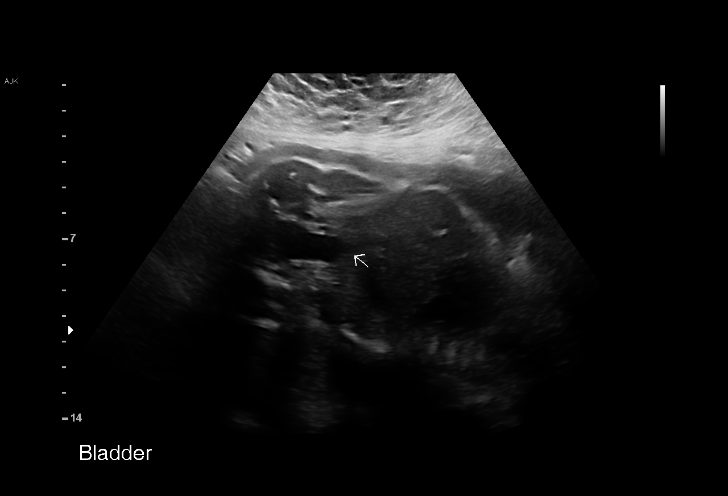

[13 of 28 positions shown; findings below may reference images not displayed]

[address]

  3  US MFM UA CORD DOPPLER               76820.02     MANUELLA TIGER
 ----------------------------------------------------------------------

 ----------------------------------------------------------------------
Indications

  33 weeks gestation of pregnancy
  Hypertension - Chronic/Pre-existing
  (labetalol)
  Previous cesarean delivery, antepartum
  Poor obstetric history: Previous fetal growth
  restriction (FGR)
  Maternal morbid obesity
  Tobacco use complicating pregnancy, third
  trimester
  Poor obstetrical history (IUGR)
  Advanced maternal age multigravida 35+,
  third trimester
 ----------------------------------------------------------------------
Vital Signs

                                                Height:        5'5"
Fetal Evaluation

 Num Of Fetuses:         1
 Fetal Heart Rate(bpm):  140
 Cardiac Activity:       Observed
 Presentation:           Cephalic
 Placenta:               Posterior

 Amniotic Fluid
 AFI FV:      Subjectively decreased

 AFI Sum(cm)     %Tile       Largest Pocket(cm)
 5.92            < 3
 RUQ(cm)       RLQ(cm)       LUQ(cm)        LLQ(cm)

Biophysical Evaluation

 Amniotic F.V:   Within normal limits       F. Tone:        Observed
 F. Movement:    Observed                   Score:          [DATE]
 F. Breathing:   Observed
Biometry

 BPD:        77  mm     G. Age:  30w 6d          2  %    CI:        73.84   %    70 - 86
                                                         FL/HC:      20.0   %    19.9 -
 HC:      284.6  mm     G. Age:  31w 2d        < 3  %    HC/AC:      1.10        0.96 -
 AC:      259.2  mm     G. Age:  30w 1d        < 3  %    FL/BPD:     73.9   %    71 - 87
 FL:       56.9  mm     G. Age:  29w 6d        < 3  %    FL/AC:      22.0   %    20 - 24
 HUM:      49.8  mm     G. Age:  29w 2d        < 5  %

 Est. FW:    1535  gm      3 lb 6 oz   < 10  %
OB History

 Gravidity:    7         Term:   1        Prem:   0        SAB:   0
 TOP:          4       Ectopic:  1        Living: 1
Gestational Age

 LMP:           33w 3d        Date:  10/31/17                 EDD:   08/07/18
 U/S Today:     30w 4d                                        EDD:   08/27/18
 Best:          33w 3d     Det. By:  LMP  (10/31/17)          EDD:   08/07/18
Anatomy

 Cranium:               Appears normal         LVOT:                   Previously seen
 Cavum:                 Not well visualized    Aortic Arch:            Not well visualized
 Ventricles:            Not well visualized    Ductal Arch:            Not well visualized
 Choroid Plexus:        Previously seen        Diaphragm:              Appears normal
 Cerebellum:            Previously seen        Stomach:                Appears normal, left
                                                                       sided
 Posterior Fossa:       Previously seen        Abdomen:                Appears normal
 Nuchal Fold:           Not applicable (>20    Abdominal Wall:         Not well visualized
                        wks GA)
 Face:                  Appears normal         Cord Vessels:           Previously seen
                        (orbits and profile)
 Lips:                  Appears normal         Kidneys:                Previously seen
 Palate:                Not well visualized    Bladder:                Appears normal
 Thoracic:              Appears normal         Spine:                  Limited views
                                                                       previously seen
 Heart:                 Previously seen        Upper Extremities:      Previously seen as
                                                                       present
 RVOT:                  Previously seen        Lower Extremities:      Present

 Other:  Fetus appears to be a male. Technically difficult due to maternal
         habitus and fetal position.
Doppler - Fetal Vessels

 Umbilical Artery
  S/D     %tile     RI              PI                     ADFV    RDFV
 6.29    > 97.5  0.84              1.6                       Yes      No
Impression

 Ms. Samara, Joseval7 P1 at 33w 3d gestation, was admitted 2
 days ago after intermittent reversed-end-diastolic flow was
 seen on ultrasound. Patient received Lalik on
 [DATE] and [DATE].
 She has chronic hypertension and takes labetalol and
 nifedipine.

 She had severe range blood pressures yesterday that
 required IV antihypertensive management (hydralazine).

 On today's ultrasound, amniotic fluid is decreased (AFI=6
 cm). The estimated fetal weight (1,525 g) is at less than the
 10th percentile (weight gain 326 grams in 3 weeks). Umbilical
 artery Doppler showed persistent absent end-diastolic flow.
 BPP [DATE]. An episode of bradycardia (69/min) with good
 recovery was seen while performing ultrasound.

 I reviewed the NST performed at [facility] unit. She had prolonged
 deceleration at [DATE] with good recovery. NST is
 nonreactive.

 Patient has several high-risk issues that can potentially lead
 to adverse fetal outcomes. She had severe range blood
 pressures despite antihypertensive medication. Low amniotic
 fluid, abnormal Doppler and nonreactive NST are associated
 with increased risk of stillbirth.

 I recommend nonurgent delivery now.

 Discussed with Dr. Eliu.
                 De Jesus, Jonelle

## 2019-08-18 DIAGNOSIS — Z419 Encounter for procedure for purposes other than remedying health state, unspecified: Secondary | ICD-10-CM | POA: Diagnosis not present

## 2019-09-18 DIAGNOSIS — Z419 Encounter for procedure for purposes other than remedying health state, unspecified: Secondary | ICD-10-CM | POA: Diagnosis not present

## 2019-10-18 DIAGNOSIS — Z419 Encounter for procedure for purposes other than remedying health state, unspecified: Secondary | ICD-10-CM | POA: Diagnosis not present

## 2019-11-06 ENCOUNTER — Other Ambulatory Visit: Payer: Self-pay

## 2019-11-06 ENCOUNTER — Ambulatory Visit (INDEPENDENT_AMBULATORY_CARE_PROVIDER_SITE_OTHER): Payer: Medicaid Other | Admitting: *Deleted

## 2019-11-06 ENCOUNTER — Other Ambulatory Visit (HOSPITAL_COMMUNITY)
Admission: RE | Admit: 2019-11-06 | Discharge: 2019-11-06 | Disposition: A | Discharge status: Home / Self Care | Payer: Medicaid Other | Source: Ambulatory Visit | Attending: Family Medicine | Admitting: Family Medicine

## 2019-11-06 VITALS — BP 173/112 | Ht 65.0 in | Wt 290.9 lb

## 2019-11-06 DIAGNOSIS — I10 Essential (primary) hypertension: Secondary | ICD-10-CM

## 2019-11-06 DIAGNOSIS — Z202 Contact with and (suspected) exposure to infections with a predominantly sexual mode of transmission: Secondary | ICD-10-CM | POA: Insufficient documentation

## 2019-11-06 NOTE — Patient Instructions (Signed)
I have made a referral to [facility] for primary care for your blood pressure.  They should contact you ; if not please call them at [phone].

## 2019-11-06 NOTE — Progress Notes (Signed)
Here for self swab - wants to be checked for std's and infection- because worried about possible exposure and also having cramping.  Will check for GC, trich, yeast and bv. Advised will be contacted if needs treatment.  BP elevated at 173/112. Not on bp meds at present . States has not followed up with Primary Care in a while.  Per chart review has not been seen since delivery but hx CHTN. Discussed with Dr. Debroah Loop and advised Cylee to go to Urgent care for treatment of high blood pressure today and stressed she is at high risk for stroke or heart attack.  Also informed her she can go to any PCP but I can put in referral if she would like. She chose referral to [facility] medicine, referral sent. Kaymarie Wynn,RN

## 2019-11-07 ENCOUNTER — Encounter (HOSPITAL_COMMUNITY): Payer: Self-pay

## 2019-11-07 ENCOUNTER — Ambulatory Visit (HOSPITAL_COMMUNITY)
Admission: EM | Admit: 2019-11-07 | Discharge: 2019-11-07 | Discharge status: Against Medical Advice | Payer: Medicaid Other

## 2019-11-07 ENCOUNTER — Other Ambulatory Visit: Payer: Self-pay

## 2019-11-07 LAB — CERVICOVAGINAL ANCILLARY ONLY
Bacterial Vaginitis (gardnerella): POSITIVE — AB
Candida Glabrata: NEGATIVE
Candida Vaginitis: NEGATIVE
Chlamydia: NEGATIVE
Comment: NEGATIVE
Comment: NEGATIVE
Comment: NEGATIVE
Comment: NEGATIVE
Comment: NEGATIVE
Comment: NORMAL
Neisseria Gonorrhea: NEGATIVE
Trichomonas: NEGATIVE

## 2019-11-07 NOTE — ED Triage Notes (Signed)
Pt states she was [facility] yesterday and her bp was 170/112. She ran out of her meds and is currently trying to get in to see a primary care doctor. Pt is aox4 and ambulatory. Complains of no dizziness.

## 2019-11-08 ENCOUNTER — Other Ambulatory Visit: Payer: Self-pay

## 2019-11-08 ENCOUNTER — Encounter (HOSPITAL_COMMUNITY): Payer: Self-pay | Admitting: Emergency Medicine

## 2019-11-08 ENCOUNTER — Other Ambulatory Visit (HOSPITAL_COMMUNITY): Payer: Self-pay | Admitting: Obstetrics & Gynecology

## 2019-11-08 ENCOUNTER — Telehealth: Payer: Self-pay | Admitting: *Deleted

## 2019-11-08 ENCOUNTER — Ambulatory Visit (HOSPITAL_COMMUNITY)
Admission: EM | Admit: 2019-11-08 | Discharge: 2019-11-08 | Disposition: A | Discharge status: Home / Self Care | Payer: Medicaid Other | Attending: Emergency Medicine | Admitting: Emergency Medicine

## 2019-11-08 DIAGNOSIS — O10919 Unspecified pre-existing hypertension complicating pregnancy, unspecified trimester: Secondary | ICD-10-CM | POA: Diagnosis not present

## 2019-11-08 DIAGNOSIS — I10 Essential (primary) hypertension: Secondary | ICD-10-CM

## 2019-11-08 DIAGNOSIS — B9689 Other specified bacterial agents as the cause of diseases classified elsewhere: Secondary | ICD-10-CM

## 2019-11-08 LAB — POCT URINALYSIS DIPSTICK, ED / UC
Bilirubin Urine: NEGATIVE
Glucose, UA: NEGATIVE mg/dL
Hgb urine dipstick: NEGATIVE
Ketones, ur: NEGATIVE mg/dL
Leukocytes,Ua: NEGATIVE
Nitrite: NEGATIVE
Protein, ur: NEGATIVE mg/dL
Specific Gravity, Urine: >=1.03 (ref 1.005–1.030)
Urobilinogen, UA: 0.2 mg/dL (ref 0.0–1.0)
pH: 7.5 (ref 5.0–8.0)

## 2019-11-08 LAB — COMPREHENSIVE METABOLIC PANEL
ALT: 15 U/L (ref 0–44)
AST: 8 U/L — ABNORMAL LOW (ref 15–41)
Albumin: 3.6 g/dL (ref 3.5–5.0)
Alkaline Phosphatase: 56 U/L (ref 38–126)
Anion gap: 8 (ref 5–15)
BUN: 8 mg/dL (ref 6–20)
CO2: 27 mmol/L (ref 22–32)
Calcium: 9.2 mg/dL (ref 8.9–10.3)
Chloride: 103 mmol/L (ref 98–111)
Creatinine, Ser: 0.89 mg/dL (ref 0.44–1.00)
GFR, Estimated: >60 mL/min (ref >60)
Glucose, Bld: 97 mg/dL (ref 70–99)
Potassium: 4.4 mmol/L (ref 3.5–5.1)
Sodium: 138 mmol/L (ref 135–145)
Total Bilirubin: 0.6 mg/dL (ref 0.3–1.2)
Total Protein: 7.3 g/dL (ref 6.5–8.1)

## 2019-11-08 MED ORDER — LISINOPRIL 20 MG PO TABS: 20.0000 mg | ORAL_TABLET | Freq: Every day | ORAL | 1 refills | Status: DC

## 2019-11-08 MED ORDER — CLONIDINE HCL 0.1 MG PO TABS
0.2000 mg | ORAL_TABLET | Freq: Once | ORAL | Status: AC
  Administered 2019-11-08: 0.2 mg via ORAL

## 2019-11-08 MED ORDER — CLONIDINE HCL 0.1 MG PO TABS
ORAL_TABLET | ORAL | Status: AC
  Filled 2019-11-08: qty 1

## 2019-11-08 MED ORDER — HYDROCHLOROTHIAZIDE 25 MG PO TABS: 25.0000 mg | ORAL_TABLET | Freq: Every day | ORAL | 0 refills | Status: DC

## 2019-11-08 MED ORDER — METRONIDAZOLE 500 MG PO TABS: 500.0000 mg | ORAL_TABLET | Freq: Two times a day (BID) | ORAL | 0 refills | Status: AC

## 2019-11-08 NOTE — ED Triage Notes (Addendum)
Pt presents with HTN. States having headaches, blurred vision, and dizziness. Has been on BP meds in the past. Denies sob or chest pain.

## 2019-11-08 NOTE — ED Provider Notes (Signed)
[facility]    CSN: [id number] Arrival date & time: 11/08/19  9794      History   Chief Complaint Chief Complaint  Patient presents with  . Hypertension    HPI Veronica Adams is a 39 y.o. female.   Veronica Adams presents with concerns about her high blood pressure. Has had hypertension in the past but no longer on medications. States she has had intermittent blurred vision and mild headaches, but denies any current symptoms. Her BP a few days ago was 170. She does not have a PCP. No chest pain , no shortness of breath . No extremity swelling.    ROS per HPI, negative if not otherwise mentioned.      Past Medical History:  Diagnosis Date  . Asthma    exacerbation on 01/31/15  . Breast abscess   . Bronchitis   . Gestational hypertension   . History of prior pregnancy with IUGR newborn   . Hypertension   . Obesity   . Preeclampsia     Patient Active Problem List   Diagnosis Date Noted  . Obesity in pregnancy 05/21/2018  . BMI 40.0-44.9, adult (HCC) 05/21/2018  . S/P cesarean section 04/11/2018  . Left breast abscess 01/15/2015    Past Surgical History:  Procedure Laterality Date  . CESAREAN SECTION    . CESAREAN SECTION WITH BILATERAL TUBAL LIGATION N/A 06/22/2018   Procedure: CESAREAN SECTION WITH BILATERAL TUBAL LIGATION;  Surgeon: Catalina Antigua, MD;  Location: [facility];  Service: Obstetrics;  Laterality: N/A;  . LAPAROSCOPY FOR ECTOPIC PREGNANCY      OB History    Gravida  7   Para  1   Term  1   Preterm      AB  5   Living  1     SAB      TAB  4   Ectopic  1   Multiple      Live Births  1            Home Medications    Prior to Admission medications   Medication Sig Start Date End Date Taking? Authorizing Provider  aspirin EC 81 MG tablet Take 81 mg by mouth daily.    [provider]  hydrochlorothiazide (HYDRODIURIL) 25 MG tablet Take 1 tablet (25 mg total) by mouth daily. 11/08/19   Georgetta Haber, NP  ibuprofen (ADVIL) 800 MG tablet Take 1 tablet (800 mg total) by mouth every 8 (eight) hours as needed. 06/24/18   Arabella Merles, CNM  lisinopril (ZESTRIL) 20 MG tablet Take 1 tablet (20 mg total) by mouth daily. 11/08/19   Georgetta Haber, NP  metroNIDAZOLE (FLAGYL) 500 MG tablet Take 1 tablet (500 mg total) by mouth 2 (two) times daily for 7 days. 11/08/19 11/15/19  Adam Phenix, MD  prenatal vitamin w/FE, FA (PRENATAL 1 + 1) 27-1 MG TABS tablet Take 1 tablet by mouth daily at 12 noon.    [provider]    Family History Family History  Problem Relation Age of Onset  . Diabetes Mother   . Hypertension Paternal Grandmother   . Heart disease Paternal Grandmother   . Stroke Father   . Breast cancer Paternal Aunt   . Breast cancer Paternal Aunt     Social History Social History   Tobacco Use  . Smoking status: Former Smoker    Packs/day: 0.25    Types: Cigarettes  . Smokeless tobacco: Never  Used  . Tobacco comment: 4 a day  Vaping Use  . Vaping Use: Never used  Substance Use Topics  . Alcohol use: Not Currently    Comment: socially   . Drug use: Not Currently    Types: Marijuana    Comment: 3 joints per week       Allergies   Patient has no known allergies.   Review of Systems Review of Systems   Physical Exam Triage Vital Signs ED Triage Vitals  Enc Vitals Group     BP 11/08/19 1004 (!) 204/126     Pulse Rate 11/08/19 1004 64     Resp 11/08/19 1004 18     Temp 11/08/19 1004 98.3 F (36.8 C)     Temp Source 11/08/19 1004 Oral     SpO2 11/08/19 1004 99 %     Weight --      Height --      Head Circumference --      Peak Flow --      Pain Score 11/08/19 1003 0     Pain Loc --      Pain Edu? --      Excl. in GC? --    No data found.  Updated Vital Signs BP (!) 179/126 (BP Location: Left Wrist)   Pulse 64   Temp 98.3 F (36.8 C) (Oral)   Resp 18   LMP 10/18/2019 (Exact Date)   SpO2 99%    Physical  Exam Constitutional:      General: She is not in acute distress.    Appearance: She is well-developed.  HENT:     Head: Normocephalic and atraumatic.  Cardiovascular:     Rate and Rhythm: Normal rate.     Heart sounds: Normal heart sounds.  Pulmonary:     Effort: Pulmonary effort is normal.  Musculoskeletal:     Right lower leg: No edema.     Left lower leg: No edema.  Skin:    General: Skin is warm and dry.  Neurological:     Mental Status: She is alert and oriented to person, place, and time.    EKG:  NSR rate of 65 . Previous EKG was available for review. No stwave changes as interpreted by me.    UC Treatments / Results  Labs (all labs ordered are listed, but only abnormal results are displayed) Labs Reviewed  COMPREHENSIVE METABOLIC PANEL - Abnormal; Notable for the following components:      Result Value   AST 8 (*)    All other components within normal limits  POCT URINALYSIS DIPSTICK, ED / UC    EKG   Radiology No results found.  Procedures Procedures (including critical care time)  Medications Ordered in UC Medications  cloNIDine (CATAPRES) tablet 0.2 mg (0.2 mg Oral Given 11/08/19 1028)    Initial Impression / Assessment and Plan / UC Course  I have reviewed the triage vital signs and the nursing notes.  Pertinent labs & imaging results that were available during my care of the patient were reviewed by me and considered in my medical decision making (see chart for details).     Denies any current symptoms of crisis currently. ekg reassuring. Mild improvement in BP while here today, s/p clonidine. Refill provided on bp meds. No proteinuria. cmp pending. Emphasized importance of follow up with a PCP. Return precautions provided. Patient verbalized understanding and agreeable to plan.   Final Clinical Impressions(s) / UC Diagnoses   Final diagnoses:  Hypertension, unspecified type     Discharge Instructions     I have sent to restart your blood  pressure medications once a day, a 30 day supply.  It is very important that you set up with a primary care provider for recheck of your blood pressure and for management of your medications.  If any headache, vision changes, chest pain, dizziness please go to the ER.     ED Prescriptions    Medication Sig Dispense Auth. Provider   hydrochlorothiazide (HYDRODIURIL) 25 MG tablet Take 1 tablet (25 mg total) by mouth daily. 30 tablet Linus Mako B, NP   lisinopril (ZESTRIL) 20 MG tablet Take 1 tablet (20 mg total) by mouth daily. 30 tablet Georgetta Haber, NP     PDMP not reviewed this encounter.   Georgetta Haber, NP 11/08/19 1204

## 2019-11-08 NOTE — Telephone Encounter (Signed)
Called pt and left message on voicemail stating that I am calling with test result information. (Pt has +BV, Rx for flagyl sent)  She may review the results on her MyChart as well as a message sent by the doctor. A prescription has been sen to her pharmacy. If she has questions, she may send a MyChart message or leave message on nurse voicemail.

## 2019-11-08 NOTE — Discharge Instructions (Signed)
I have sent to restart your blood pressure medications once a day, a 30 day supply.  It is very important that you set up with a primary care provider for recheck of your blood pressure and for management of your medications.  If any headache, vision changes, chest pain, dizziness please go to the ER.

## 2019-11-18 DIAGNOSIS — Z419 Encounter for procedure for purposes other than remedying health state, unspecified: Secondary | ICD-10-CM | POA: Diagnosis not present

## 2019-12-18 DIAGNOSIS — Z419 Encounter for procedure for purposes other than remedying health state, unspecified: Secondary | ICD-10-CM | POA: Diagnosis not present

## 2019-12-24 ENCOUNTER — Ambulatory Visit: Payer: Medicaid Other | Admitting: Obstetrics & Gynecology

## 2020-01-18 DIAGNOSIS — Z419 Encounter for procedure for purposes other than remedying health state, unspecified: Secondary | ICD-10-CM | POA: Diagnosis not present

## 2020-02-18 DIAGNOSIS — Z419 Encounter for procedure for purposes other than remedying health state, unspecified: Secondary | ICD-10-CM | POA: Diagnosis not present

## 2020-03-17 DIAGNOSIS — Z419 Encounter for procedure for purposes other than remedying health state, unspecified: Secondary | ICD-10-CM | POA: Diagnosis not present

## 2020-04-17 DIAGNOSIS — Z419 Encounter for procedure for purposes other than remedying health state, unspecified: Secondary | ICD-10-CM | POA: Diagnosis not present

## 2020-05-17 DIAGNOSIS — Z419 Encounter for procedure for purposes other than remedying health state, unspecified: Secondary | ICD-10-CM | POA: Diagnosis not present

## 2020-06-17 DIAGNOSIS — Z419 Encounter for procedure for purposes other than remedying health state, unspecified: Secondary | ICD-10-CM | POA: Diagnosis not present

## 2020-07-17 DIAGNOSIS — Z419 Encounter for procedure for purposes other than remedying health state, unspecified: Secondary | ICD-10-CM | POA: Diagnosis not present

## 2020-08-17 DIAGNOSIS — Z419 Encounter for procedure for purposes other than remedying health state, unspecified: Secondary | ICD-10-CM | POA: Diagnosis not present

## 2020-09-17 DIAGNOSIS — Z419 Encounter for procedure for purposes other than remedying health state, unspecified: Secondary | ICD-10-CM | POA: Diagnosis not present

## 2020-10-17 DIAGNOSIS — Z419 Encounter for procedure for purposes other than remedying health state, unspecified: Secondary | ICD-10-CM | POA: Diagnosis not present

## 2020-11-04 ENCOUNTER — Other Ambulatory Visit: Payer: Self-pay

## 2020-11-04 ENCOUNTER — Encounter (HOSPITAL_COMMUNITY): Payer: Self-pay | Admitting: Emergency Medicine

## 2020-11-04 ENCOUNTER — Emergency Department (HOSPITAL_COMMUNITY)
Admission: EM | Admit: 2020-11-04 | Discharge: 2020-11-04 | Disposition: A | Discharge status: Home / Self Care | Payer: Medicaid Other | Attending: Emergency Medicine | Admitting: Emergency Medicine

## 2020-11-04 ENCOUNTER — Emergency Department (HOSPITAL_COMMUNITY): Payer: Medicaid Other

## 2020-11-04 ENCOUNTER — Encounter (HOSPITAL_COMMUNITY): Payer: Self-pay

## 2020-11-04 ENCOUNTER — Ambulatory Visit (HOSPITAL_COMMUNITY)
Admission: EM | Admit: 2020-11-04 | Discharge: 2020-11-04 | Disposition: A | Discharge status: Home / Self Care | Payer: Medicaid Other

## 2020-11-04 DIAGNOSIS — R519 Headache, unspecified: Secondary | ICD-10-CM | POA: Diagnosis not present

## 2020-11-04 DIAGNOSIS — Z79899 Other long term (current) drug therapy: Secondary | ICD-10-CM | POA: Diagnosis not present

## 2020-11-04 DIAGNOSIS — I10 Essential (primary) hypertension: Secondary | ICD-10-CM | POA: Diagnosis not present

## 2020-11-04 DIAGNOSIS — J45909 Unspecified asthma, uncomplicated: Secondary | ICD-10-CM | POA: Diagnosis not present

## 2020-11-04 DIAGNOSIS — Z7982 Long term (current) use of aspirin: Secondary | ICD-10-CM | POA: Diagnosis not present

## 2020-11-04 DIAGNOSIS — Z87891 Personal history of nicotine dependence: Secondary | ICD-10-CM | POA: Diagnosis not present

## 2020-11-04 LAB — COMPREHENSIVE METABOLIC PANEL
ALT: 14 U/L (ref 0–44)
AST: 18 U/L (ref 15–41)
Albumin: 3.3 g/dL — ABNORMAL LOW (ref 3.5–5.0)
Alkaline Phosphatase: 65 U/L (ref 38–126)
Anion gap: 7 (ref 5–15)
BUN: 11 mg/dL (ref 6–20)
CO2: 28 mmol/L (ref 22–32)
Calcium: 8.9 mg/dL (ref 8.9–10.3)
Chloride: 103 mmol/L (ref 98–111)
Creatinine, Ser: 0.93 mg/dL (ref 0.44–1.00)
GFR, Estimated: >60 mL/min (ref >60)
Glucose, Bld: 101 mg/dL — ABNORMAL HIGH (ref 70–99)
Potassium: 3.8 mmol/L (ref 3.5–5.1)
Sodium: 138 mmol/L (ref 135–145)
Total Bilirubin: 0.4 mg/dL (ref 0.3–1.2)
Total Protein: 6.9 g/dL (ref 6.5–8.1)

## 2020-11-04 LAB — CBC WITH DIFFERENTIAL/PLATELET
Abs Immature Granulocytes: 0.01 10*3/uL (ref 0.00–0.07)
Basophils Absolute: 0.1 10*3/uL (ref 0.0–0.1)
Basophils Relative: 1 %
Eosinophils Absolute: 0.3 10*3/uL (ref 0.0–0.5)
Eosinophils Relative: 6 %
HCT: 43.2 % (ref 36.0–46.0)
Hemoglobin: 13.5 g/dL (ref 12.0–15.0)
Immature Granulocytes: 0 %
Lymphocytes Relative: 42 %
Lymphs Abs: 2.5 10*3/uL (ref 0.7–4.0)
MCH: 29.7 pg (ref 26.0–34.0)
MCHC: 31.3 g/dL (ref 30.0–36.0)
MCV: 94.9 fL (ref 80.0–100.0)
Monocytes Absolute: 0.4 10*3/uL (ref 0.1–1.0)
Monocytes Relative: 6 %
Neutro Abs: 2.6 10*3/uL (ref 1.7–7.7)
Neutrophils Relative %: 45 %
Platelets: 252 10*3/uL (ref 150–400)
RBC: 4.55 MIL/uL (ref 3.87–5.11)
RDW: 13.3 % (ref 11.5–15.5)
WBC: 5.9 10*3/uL (ref 4.0–10.5)
nRBC: 0 % (ref 0.0–0.2)

## 2020-11-04 LAB — URINALYSIS, ROUTINE W REFLEX MICROSCOPIC
Bacteria, UA: NONE SEEN
Bilirubin Urine: NEGATIVE
Glucose, UA: NEGATIVE mg/dL
Ketones, ur: NEGATIVE mg/dL
Leukocytes,Ua: NEGATIVE
Nitrite: NEGATIVE
Protein, ur: NEGATIVE mg/dL
Specific Gravity, Urine: 1.02 (ref 1.005–1.030)
pH: 6 (ref 5.0–8.0)

## 2020-11-04 LAB — I-STAT BETA HCG BLOOD, ED (MC, WL, AP ONLY): I-stat hCG, quantitative: <5 m[IU]/mL (ref <5)

## 2020-11-04 MED ORDER — ACETAMINOPHEN 325 MG PO TABS
650.0000 mg | ORAL_TABLET | Freq: Once | ORAL | Status: AC
  Administered 2020-11-04: 650 mg via ORAL
  Filled 2020-11-04: qty 2

## 2020-11-04 MED ORDER — HYDROCHLOROTHIAZIDE 25 MG PO TABS
25.0000 mg | ORAL_TABLET | Freq: Every day | ORAL | Status: DC
  Administered 2020-11-04: 25 mg via ORAL
  Filled 2020-11-04: qty 1

## 2020-11-04 MED ORDER — HYDROCHLOROTHIAZIDE 25 MG PO TABS: 25.0000 mg | ORAL_TABLET | Freq: Every day | ORAL | 1 refills | Status: DC

## 2020-11-04 NOTE — ED Triage Notes (Signed)
Pt sent from Urgent Care, pt reports hx HTN, new HA last night with blurred vision. No focal deficits noted.

## 2020-11-04 NOTE — ED Provider Notes (Signed)
[facility] EMERGENCY DEPARTMENT Provider Note   CSN: [id number] Arrival date & time: 11/04/20  1535     History Chief Complaint  Patient presents with   Hypertension    Veronica Adams is a 40 y.o. female.  Patient presents to the ED with elevated blood pressure and headache x 1 day. Patient has a history of gestational HTN, which was managed with hctz and lisinopril. Pt states she has stopped both of those medications a year ago and was managing her blood pressure through diet, although she admits to not following up with a PCP for blood pressure management. She states she has not had any issues with her blood pressure in that time until now. She does mention a previous hospitalization due to her blood pressure during pregnancy 2 years ago, with today's symptoms feeling similar. She does admit to increased stress at work recently. Her headache she considers to be dull/achy in nature and 8/10.  It is mainly right-sided.  It started last night and had gradual onset.  It did not get better overnight and was worse this morning.  She admits to blurred vision and nausea associated with her headache, but denies vomiting, CP, SOB, extremity edema, abdominal pain, urinary/bowel changes. She also denies weakness, LOC, numbness/tingling. She has only tried OTC pain medication for the headache w/ no relief. Pt is a current smoker and smokes on average 10 cigarettes a day. The onset of this condition was acute. The course is constant. Aggravating factors: none. Alleviating factors: none.        Past Medical History:  Diagnosis Date   Asthma    exacerbation on 01/31/15   Breast abscess    Bronchitis    Gestational hypertension    History of prior pregnancy with IUGR newborn    Hypertension    Obesity    Preeclampsia     Patient Active Problem List   Diagnosis Date Noted   Obesity in pregnancy 05/21/2018   BMI 40.0-44.9, adult (HCC) 05/21/2018   S/P cesarean section  04/11/2018   Left breast abscess 01/15/2015    Past Surgical History:  Procedure Laterality Date   CESAREAN SECTION     CESAREAN SECTION WITH BILATERAL TUBAL LIGATION N/A 06/22/2018   Procedure: CESAREAN SECTION WITH BILATERAL TUBAL LIGATION;  Surgeon: Catalina Antigua, MD;  Location: [facility] LD ORS;  Service: Obstetrics;  Laterality: N/A;   LAPAROSCOPY FOR ECTOPIC PREGNANCY       OB History     Gravida  7   Para  1   Term  1   Preterm      AB  5   Living  1      SAB      IAB  4   Ectopic  1   Multiple      Live Births  1           Family History  Problem Relation Age of Onset   Diabetes Mother    Hypertension Paternal Grandmother    Heart disease Paternal Grandmother    Stroke Father    Breast cancer Paternal Aunt    Breast cancer Paternal Aunt     Social History   Tobacco Use   Smoking status: Former    Packs/day: 0.25    Types: Cigarettes   Smokeless tobacco: Never   Tobacco comments:    4 a day  Vaping Use   Vaping Use: Never used  Substance Use Topics   Alcohol use: Not  Currently    Comment: socially    Drug use: Not Currently    Types: Marijuana    Comment: 3 joints per week      Home Medications Prior to Admission medications   Medication Sig Start Date End Date Taking? Authorizing Provider  aspirin EC 81 MG tablet Take 81 mg by mouth daily.    [provider]  hydrochlorothiazide (HYDRODIURIL) 25 MG tablet Take 1 tablet (25 mg total) by mouth daily. 11/08/19   Georgetta Haber, NP  ibuprofen (ADVIL) 800 MG tablet Take 1 tablet (800 mg total) by mouth every 8 (eight) hours as needed. 06/24/18   Arabella Merles, CNM  lisinopril (ZESTRIL) 20 MG tablet Take 1 tablet (20 mg total) by mouth daily. 11/08/19   Georgetta Haber, NP  prenatal vitamin w/FE, FA (PRENATAL 1 + 1) 27-1 MG TABS tablet Take 1 tablet by mouth daily at 12 noon.    [provider]    Allergies    Patient has no known allergies.  Review of Systems    Review of Systems  Constitutional:  Negative for fever.  HENT:  Negative for congestion, dental problem, rhinorrhea and sinus pressure.   Eyes:  Positive for visual disturbance (blurry only, no loss of vision). Negative for photophobia, discharge and redness.  Respiratory:  Negative for shortness of breath.   Cardiovascular:  Negative for chest pain.  Gastrointestinal:  Negative for nausea and vomiting.  Musculoskeletal:  Negative for gait problem, neck pain and neck stiffness.  Skin:  Negative for rash.  Neurological:  Positive for headaches. Negative for syncope, speech difficulty, weakness, light-headedness and numbness.  Psychiatric/Behavioral:  Negative for confusion.    Physical Exam Updated Vital Signs BP (!) 169/103   Pulse 66   Temp 98.6 F (37 C) (Oral)   Resp 18   Ht 5\' 5"  (1.651 m)   Wt 127 kg   LMP 11/01/2020 (Exact Date)   SpO2 100%   BMI 46.59 kg/m   Physical Exam Vitals and nursing note reviewed.  Constitutional:      Appearance: She is well-developed.  HENT:     Head: Normocephalic and atraumatic.     Right Ear: Tympanic membrane, ear canal and external ear normal.     Left Ear: Tympanic membrane, ear canal and external ear normal.     Nose: Nose normal.     Mouth/Throat:     Pharynx: Uvula midline.  Eyes:     General: Lids are normal.     Extraocular Movements:     Right eye: No nystagmus.     Left eye: No nystagmus.     Conjunctiva/sclera: Conjunctivae normal.     Pupils: Pupils are equal, round, and reactive to light.  Cardiovascular:     Rate and Rhythm: Normal rate and regular rhythm.  Pulmonary:     Effort: Pulmonary effort is normal.     Breath sounds: Normal breath sounds.  Abdominal:     Palpations: Abdomen is soft.     Tenderness: There is no abdominal tenderness.  Musculoskeletal:     Cervical back: Normal range of motion and neck supple. No tenderness or bony tenderness.  Skin:    General: Skin is warm and dry.  Neurological:      Mental Status: She is alert and oriented to person, place, and time.     GCS: GCS eye subscore is 4. GCS verbal subscore is 5. GCS motor subscore is 6.     Cranial  Nerves: No cranial nerve deficit.     Sensory: No sensory deficit.     Motor: No weakness.     Coordination: Coordination normal.     Gait: Gait normal.     Comments: Upper extremity myotomes tested bilaterally:  C5 Shoulder abduction 5/5 C6 Elbow flexion/wrist extension 5/5 C7 Elbow extension 5/5 C8 Finger flexion 5/5 T1 Finger abduction 5/5  Lower extremity myotomes tested bilaterally: L2 Hip flexion 5/5 L3 Knee extension 5/5 L4 Ankle dorsiflexion 5/5 S1 Ankle plantar flexion 5/5     ED Results / Procedures / Treatments   Labs (all labs ordered are listed, but only abnormal results are displayed) Labs Reviewed  COMPREHENSIVE METABOLIC PANEL - Abnormal; Notable for the following components:      Result Value   Glucose, Bld 101 (*)    Albumin 3.3 (*)    All other components within normal limits  URINALYSIS, ROUTINE W REFLEX MICROSCOPIC - Abnormal; Notable for the following components:   Hgb urine dipstick MODERATE (*)    All other components within normal limits  CBC WITH DIFFERENTIAL/PLATELET  I-STAT BETA HCG BLOOD, ED ([facility], [facility], [facility] ONLY)    ED ECG REPORT   Date: 11/04/2020  Rate: 69  Rhythm: normal sinus rhythm  QRS Axis: normal  Intervals: normal  ST/T Wave abnormalities: normal  Conduction Disutrbances:none  Narrative Interpretation:   Old EKG Reviewed: unchanged  I have personally reviewed the EKG tracing and agree with the computerized printout as noted.   Radiology CT Head Wo Contrast  Result Date: 11/04/2020 CLINICAL DATA:  Headache, vision change.  Hypertension EXAM: CT HEAD WITHOUT CONTRAST TECHNIQUE: Contiguous axial images were obtained from the base of the skull through the vertex without intravenous contrast. COMPARISON:  CT head 03/24/2009 FINDINGS: Brain: No evidence of acute  infarction, hemorrhage, hydrocephalus, extra-axial collection or mass lesion/mass effect. Empty sella, incidental finding and unchanged. Vascular: Negative for hyperdense vessel Skull: Negative Sinuses/Orbits: Mucosal edema paranasal sinuses without air-fluid level. Negative orbit Other: None IMPRESSION: Negative CT of the brain Sinus mucosal edema Electronically Signed   By: Marlan Palau M.D.   On: 11/04/2020 18:34    Procedures Procedures   Medications Ordered in ED Medications - No data to display  ED Course  I have reviewed the triage vital signs and the nursing notes.  Pertinent labs & imaging results that were available during my care of the patient were reviewed by me and considered in my medical decision making (see chart for details).  Patient seen and examined. Work-up initiated in triage reviewed.  I did add a head CT due to vision change and patient with significant hypertension and headache.  Patient was seen earlier at urgent care and referred to the emergency department.  Will give Tylenol and likely restart HCTZ.  Vital signs reviewed and are as follows: BP (!) 169/103   Pulse 66   Temp 98.6 F (37 C) (Oral)   Resp 18   Ht 5\' 5"  (1.651 m)   Wt 127 kg   LMP 11/01/2020 (Exact Date)   SpO2 100%   BMI 46.59 kg/m   Creatinine is normal.  EKG nonischemic.  Head CT normal.  Will give first dose of HCTZ here and discharged home.  Patient will need PCP referrals.  Patient counseled to return if they have weakness in their arms or legs, slurred speech, trouble walking or talking, confusion, trouble with their balance, or if they have any other concerns. Patient verbalizes understanding and agrees with plan.  MDM Rules/Calculators/A&P                           Patient without high-risk features of headache including: sudden onset/thunderclap HA, no similar headache in past, altered mental status, accompanying seizure, headache with exertion, age > 89, history of  immunocompromise, neck or shoulder pain, fever, use of anticoagulation, family history of spontaneous SAH, concomitant drug use, toxic exposure.   Patient has a normal complete neurological exam, normal vital signs, normal level of consciousness, no signs of meningismus, is well-appearing/non-toxic appearing, no signs of trauma.   CT imaging was negative for bleeding.   No dangerous or life-threatening conditions suspected or identified by history, physical exam, and by work-up. No indications for hospitalization identified.   HTN: Likely chronic, currently not treated.  No endorgan damage here with normal kidney function no signs of pulmonary edema, vascular compromise, vision loss, or stroke.  Head CT negative for bleeding.  Final Clinical Impression(s) / ED Diagnoses Final diagnoses:  Hypertension, unspecified type  Acute nonintractable headache, unspecified headache type    Rx / DC Orders ED Discharge Orders          Ordered    hydrochlorothiazide (HYDRODIURIL) 25 MG tablet  Daily        11/04/20 1857             Renne Crigler, PA-C 11/04/20 1902    Ernie Avena, MD 11/04/20 2306

## 2020-11-04 NOTE — ED Provider Notes (Signed)
Emergency Medicine Provider Triage Evaluation Note  Veronica Adams , a 40 y.o. female  was evaluated in triage.  Pt complains of hypertension and headache.  Patient reports that headache started yesterday evening.  Headache onset was gradual pain progressively worse over time.  Pain is located across frontotemporal aspect of head.  Headache has been constant since then.  Patient also reports high blood pressure.  Reports history of hypertension however is currently noncompliant with medication.    Review of Systems  Positive: Headache, hypertension,  Negative: Numbness, weakness, facial asymmetry, dysarthria, chest pain, shortness of breath, difficulty urinating  Physical Exam  BP (!) 195/126 (BP Location: Right Arm)   Pulse 67   Temp 98.6 F (37 C) (Oral)   Resp 18   LMP 11/01/2020   SpO2 100%  Gen:   Awake, no distress   Resp:  Normal effort  MSK:   Moves extremities without difficulty  Other:  Pronator drift negative, grip strength equal, no dysarthria or facial asymmetry.  Medical Decision Making  Medically screening exam initiated at 3:54 PM.  Appropriate orders placed.  [name] was informed that the remainder of the evaluation will be completed by another provider, this initial triage assessment does not replace that evaluation, and the importance of remaining in the ED until their evaluation is complete.     Haskel Schroeder, PA-C 11/04/20 1555    Gwyneth Sprout, MD 11/11/20 1545

## 2020-11-04 NOTE — ED Triage Notes (Signed)
Pt reports headache since last night and feeling lightheaded since this morning.

## 2020-11-04 NOTE — Discharge Instructions (Addendum)
Please read and follow all provided instructions.  Your diagnoses today include:  1. Hypertension, unspecified type   2. Acute nonintractable headache, unspecified headache type     Tests performed today include: CT of your head which was normal and did not show any serious cause of your headache Complete blood cell count: No problems Basic metabolic panel: No problems, normal kidney function Urinalysis (urine test): negative Pregnancy test (urine or blood, in women only): negative Vital signs. See below for your results today.   Medications:  Hydrochlorothiazide - medication for high blood pressure  Take any prescribed medications only as directed.  Additional information:  Follow any educational materials contained in this packet.  You are having a headache. No specific cause was found today for your headache. It may have been a migraine or other cause of headache. Stress, anxiety, fatigue, and depression are common triggers for headaches.   Your headache today does not appear to be life-threatening or require hospitalization, but often the exact cause of headaches is not determined in the emergency department. Therefore, follow-up with your doctor is very important to find out what may have caused your headache and whether or not you need any further diagnostic testing or treatment.   Sometimes headaches can appear benign (not harmful), but then more serious symptoms can develop which should prompt an immediate re-evaluation by your doctor or the emergency department.  BE VERY CAREFUL not to take multiple medicines containing Tylenol (also called acetaminophen). Doing so can lead to an overdose which can damage your liver and cause liver failure and possibly death.   Follow-up instructions: Please follow-up with your primary care provider in the next 3 days for further evaluation of your symptoms.   Return instructions:  Please return to the Emergency Department if you  experience worsening symptoms. Return if the medications do not resolve your headache, if it recurs, or if you have multiple episodes of vomiting or cannot keep down fluids. Return if you have a change from the usual headache. RETURN IMMEDIATELY IF you: Develop a sudden, severe headache Develop confusion or become poorly responsive or faint Develop a fever above 100.49F or problem breathing Have a change in speech, vision, swallowing, or understanding Develop new weakness, numbness, tingling, incoordination in your arms or legs Have a seizure Please return if you have any other emergent concerns.  Additional Information:  Your vital signs today were: BP (!) 169/103   Pulse 66   Temp 98.6 F (37 C) (Oral)   Resp 18   Ht 5\' 5"  (1.651 m)   Wt 127 kg   LMP 11/01/2020 (Exact Date)   SpO2 100%   BMI 46.59 kg/m  If your blood pressure (BP) was elevated above 135/85 this visit, please have this repeated by your doctor within one month. --------------

## 2020-11-17 DIAGNOSIS — Z419 Encounter for procedure for purposes other than remedying health state, unspecified: Secondary | ICD-10-CM | POA: Diagnosis not present

## 2020-12-17 DIAGNOSIS — Z419 Encounter for procedure for purposes other than remedying health state, unspecified: Secondary | ICD-10-CM | POA: Diagnosis not present

## 2021-01-17 DIAGNOSIS — Z419 Encounter for procedure for purposes other than remedying health state, unspecified: Secondary | ICD-10-CM | POA: Diagnosis not present

## 2021-02-17 DIAGNOSIS — Z419 Encounter for procedure for purposes other than remedying health state, unspecified: Secondary | ICD-10-CM | POA: Diagnosis not present

## 2021-03-17 DIAGNOSIS — Z419 Encounter for procedure for purposes other than remedying health state, unspecified: Secondary | ICD-10-CM | POA: Diagnosis not present

## 2021-04-17 DIAGNOSIS — Z419 Encounter for procedure for purposes other than remedying health state, unspecified: Secondary | ICD-10-CM | POA: Diagnosis not present

## 2021-05-17 DIAGNOSIS — Z419 Encounter for procedure for purposes other than remedying health state, unspecified: Secondary | ICD-10-CM | POA: Diagnosis not present

## 2021-06-17 DIAGNOSIS — Z419 Encounter for procedure for purposes other than remedying health state, unspecified: Secondary | ICD-10-CM | POA: Diagnosis not present

## 2021-07-17 DIAGNOSIS — Z419 Encounter for procedure for purposes other than remedying health state, unspecified: Secondary | ICD-10-CM | POA: Diagnosis not present

## 2021-08-17 DIAGNOSIS — Z419 Encounter for procedure for purposes other than remedying health state, unspecified: Secondary | ICD-10-CM | POA: Diagnosis not present

## 2021-09-17 DIAGNOSIS — Z419 Encounter for procedure for purposes other than remedying health state, unspecified: Secondary | ICD-10-CM | POA: Diagnosis not present

## 2021-10-17 DIAGNOSIS — Z419 Encounter for procedure for purposes other than remedying health state, unspecified: Secondary | ICD-10-CM | POA: Diagnosis not present

## 2021-11-17 DIAGNOSIS — Z419 Encounter for procedure for purposes other than remedying health state, unspecified: Secondary | ICD-10-CM | POA: Diagnosis not present

## 2021-12-17 DIAGNOSIS — Z419 Encounter for procedure for purposes other than remedying health state, unspecified: Secondary | ICD-10-CM | POA: Diagnosis not present

## 2022-01-13 ENCOUNTER — Encounter (HOSPITAL_COMMUNITY): Payer: Self-pay

## 2022-01-13 ENCOUNTER — Other Ambulatory Visit: Payer: Self-pay

## 2022-01-13 ENCOUNTER — Emergency Department (HOSPITAL_COMMUNITY): Payer: Medicaid Other

## 2022-01-13 ENCOUNTER — Emergency Department (HOSPITAL_COMMUNITY)
Admission: EM | Admit: 2022-01-13 | Discharge: 2022-01-14 | Disposition: A | Discharge status: Home / Self Care | Payer: Medicaid Other | Attending: Student | Admitting: Student

## 2022-01-13 DIAGNOSIS — I10 Essential (primary) hypertension: Secondary | ICD-10-CM | POA: Insufficient documentation

## 2022-01-13 DIAGNOSIS — Z1152 Encounter for screening for COVID-19: Secondary | ICD-10-CM | POA: Insufficient documentation

## 2022-01-13 DIAGNOSIS — J111 Influenza due to unidentified influenza virus with other respiratory manifestations: Secondary | ICD-10-CM

## 2022-01-13 DIAGNOSIS — R059 Cough, unspecified: Secondary | ICD-10-CM | POA: Diagnosis present

## 2022-01-13 DIAGNOSIS — Z87891 Personal history of nicotine dependence: Secondary | ICD-10-CM | POA: Diagnosis not present

## 2022-01-13 DIAGNOSIS — J101 Influenza due to other identified influenza virus with other respiratory manifestations: Secondary | ICD-10-CM | POA: Diagnosis not present

## 2022-01-13 DIAGNOSIS — Z7982 Long term (current) use of aspirin: Secondary | ICD-10-CM | POA: Insufficient documentation

## 2022-01-13 DIAGNOSIS — Z79899 Other long term (current) drug therapy: Secondary | ICD-10-CM | POA: Insufficient documentation

## 2022-01-13 DIAGNOSIS — J45909 Unspecified asthma, uncomplicated: Secondary | ICD-10-CM | POA: Diagnosis not present

## 2022-01-13 DIAGNOSIS — N179 Acute kidney failure, unspecified: Secondary | ICD-10-CM | POA: Diagnosis not present

## 2022-01-13 DIAGNOSIS — H538 Other visual disturbances: Secondary | ICD-10-CM | POA: Insufficient documentation

## 2022-01-13 LAB — RESP PANEL BY RT-PCR (RSV, FLU A&B, COVID)  RVPGX2
Influenza A by PCR: POSITIVE — AB
Influenza B by PCR: NEGATIVE
Resp Syncytial Virus by PCR: NEGATIVE
SARS Coronavirus 2 by RT PCR: NEGATIVE

## 2022-01-13 LAB — COMPREHENSIVE METABOLIC PANEL
ALT: 37 U/L (ref 0–44)
AST: 94 U/L — ABNORMAL HIGH (ref 15–41)
Albumin: 3.7 g/dL (ref 3.5–5.0)
Alkaline Phosphatase: 48 U/L (ref 38–126)
Anion gap: 11 (ref 5–15)
BUN: 17 mg/dL (ref 6–20)
CO2: 23 mmol/L (ref 22–32)
Calcium: 8.6 mg/dL — ABNORMAL LOW (ref 8.9–10.3)
Chloride: 103 mmol/L (ref 98–111)
Creatinine, Ser: 2.07 mg/dL — ABNORMAL HIGH (ref 0.44–1.00)
GFR, Estimated: 30 mL/min — ABNORMAL LOW (ref >60)
Glucose, Bld: 145 mg/dL — ABNORMAL HIGH (ref 70–99)
Potassium: 3.2 mmol/L — ABNORMAL LOW (ref 3.5–5.1)
Sodium: 137 mmol/L (ref 135–145)
Total Bilirubin: 0.5 mg/dL (ref 0.3–1.2)
Total Protein: 8.2 g/dL — ABNORMAL HIGH (ref 6.5–8.1)

## 2022-01-13 LAB — CBC
HCT: 48.2 % — ABNORMAL HIGH (ref 36.0–46.0)
Hemoglobin: 15.8 g/dL — ABNORMAL HIGH (ref 12.0–15.0)
MCH: 30.3 pg (ref 26.0–34.0)
MCHC: 32.8 g/dL (ref 30.0–36.0)
MCV: 92.5 fL (ref 80.0–100.0)
Platelets: 180 10*3/uL (ref 150–400)
RBC: 5.21 MIL/uL — ABNORMAL HIGH (ref 3.87–5.11)
RDW: 13.5 % (ref 11.5–15.5)
WBC: 5.1 10*3/uL (ref 4.0–10.5)
nRBC: 0 % (ref 0.0–0.2)

## 2022-01-13 LAB — I-STAT BETA HCG BLOOD, ED (MC, WL, AP ONLY): I-stat hCG, quantitative: <5 m[IU]/mL (ref <5)

## 2022-01-13 LAB — LIPASE, BLOOD: Lipase: 45 U/L (ref 11–51)

## 2022-01-13 MED ORDER — SODIUM CHLORIDE 0.9 % IV BOLUS: 1000.0000 mL | Freq: Once | INTRAVENOUS | Status: DC

## 2022-01-13 NOTE — ED Triage Notes (Signed)
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   Admits no strong productive cough with n/v/d x 5 days.   Today began having blurred vision.

## 2022-01-13 NOTE — ED Provider Triage Note (Signed)
Emergency Medicine Provider Triage Evaluation Note  Veronica Adams , a 41 y.o. female  was evaluated in triage.  Pt complains of 5-day history of diarrhea, cough, nasal congestion, general malaise.  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  She denies any fever or chills. No abdominal pain.  Review of Systems  Positive:  Negative: See above   Physical Exam  BP (!) 141/85   Pulse 91   Temp 98.6 F (37 C)   Resp 20   Ht 5\' 5"  (1.651 m)   Wt 127 kg   LMP 01/11/2022   SpO2 96%   BMI 46.59 kg/m  Gen:   Awake, no distress   Resp:  Normal effort  MSK:   Moves extremities without difficulty  Other:    Medical Decision Making  Medically screening exam initiated at 7:47 PM.  Appropriate orders placed.  01/13/2022 was informed that the remainder of the evaluation will be completed by another provider, this initial triage assessment does not replace that evaluation, and the importance of remaining in the ED until their evaluation is complete.     [name] [place], [place] 01/13/22 1948

## 2022-01-14 MED ORDER — POTASSIUM CHLORIDE CRYS ER 20 MEQ PO TBCR
40.0000 meq | EXTENDED_RELEASE_TABLET | Freq: Once | ORAL | Status: AC
  Administered 2022-01-14: 40 meq via ORAL
  Filled 2022-01-14: qty 2

## 2022-01-14 MED ORDER — LACTATED RINGERS IV BOLUS
1000.0000 mL | Freq: Once | INTRAVENOUS | Status: AC
  Administered 2022-01-14: 1000 mL via INTRAVENOUS

## 2022-01-14 MED ORDER — AMOXICILLIN-POT CLAVULANATE 875-125 MG PO TABS: 1.0000 | ORAL_TABLET | Freq: Two times a day (BID) | ORAL | 0 refills | Status: DC

## 2022-01-14 MED ORDER — ACETAMINOPHEN 500 MG PO TABS
1000.0000 mg | ORAL_TABLET | Freq: Once | ORAL | Status: AC
  Administered 2022-01-14: 1000 mg via ORAL
  Filled 2022-01-14: qty 2

## 2022-01-14 MED ORDER — SODIUM CHLORIDE 0.9 % IV SOLN
500.0000 mg | Freq: Once | INTRAVENOUS | Status: AC
  Administered 2022-01-14: 500 mg via INTRAVENOUS
  Filled 2022-01-14: qty 5

## 2022-01-14 MED ORDER — SODIUM CHLORIDE 0.9 % IV SOLN
2.0000 g | Freq: Once | INTRAVENOUS | Status: AC
  Administered 2022-01-14: 2 g via INTRAVENOUS
  Filled 2022-01-14: qty 20

## 2022-01-14 MED ORDER — LOPERAMIDE HCL 2 MG PO CAPS: 2.0000 mg | ORAL_CAPSULE | Freq: Four times a day (QID) | ORAL | 0 refills | Status: DC | PRN

## 2022-01-14 MED ORDER — LOPERAMIDE HCL 2 MG PO CAPS
2.0000 mg | ORAL_CAPSULE | Freq: Once | ORAL | Status: AC
  Administered 2022-01-14: 2 mg via ORAL
  Filled 2022-01-14: qty 1

## 2022-01-14 MED ORDER — ONDANSETRON HCL 4 MG/2ML IJ SOLN
4.0000 mg | Freq: Once | INTRAMUSCULAR | Status: AC
  Administered 2022-01-14: 4 mg via INTRAVENOUS
  Filled 2022-01-14: qty 2

## 2022-01-14 MED ORDER — POTASSIUM CHLORIDE CRYS ER 20 MEQ PO TBCR: 40.0000 meq | EXTENDED_RELEASE_TABLET | Freq: Once | ORAL | Status: DC

## 2022-01-14 MED ORDER — ONDANSETRON 4 MG PO TBDP: 4.0000 mg | ORAL_TABLET | Freq: Three times a day (TID) | ORAL | 0 refills | Status: DC | PRN

## 2022-01-14 MED ORDER — MAGNESIUM OXIDE -MG SUPPLEMENT 400 (240 MG) MG PO TABS
800.0000 mg | ORAL_TABLET | Freq: Once | ORAL | Status: AC
  Administered 2022-01-14: 800 mg via ORAL
  Filled 2022-01-14: qty 2

## 2022-01-14 NOTE — ED Provider Notes (Signed)
[facility]-EMERGENCY DEPT Provider Note  CSN: [id number] Arrival date & time: 01/13/22 1907  Chief Complaint(s) Cough and Blurred Vision  HPI Veronica Parkinsicole L Adams is a 41 y.o. female with PMH asthma, HTN, obesity who presents emergency department for evaluation of cough, lightheadedness, decreased p.o. intake, diarrhea and exertional dyspnea.  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  She states that she is having trouble eating and drinking because she has decreased appetite and every time she eats she has watery diarrhea.  Denies chest pain, abdominal pain, headache, or other systemic symptoms.   Past Medical History Past Medical History:  Diagnosis Date   Asthma    exacerbation on 01/31/15   Breast abscess    Bronchitis    Gestational hypertension    History of prior pregnancy with IUGR newborn    Hypertension    Obesity    Preeclampsia    Patient Active Problem List   Diagnosis Date Noted   Obesity in pregnancy 05/21/2018   BMI 40.0-44.9, adult (HCC) 05/21/2018   S/P cesarean section 04/11/2018   Left breast abscess 01/15/2015   Home Medication(s) Prior to Admission medications   Medication Sig Start Date End Date Taking? Authorizing Provider  aspirin EC 81 MG tablet Take 81 mg by mouth daily.    [provider]  hydrochlorothiazide (HYDRODIURIL) 25 MG tablet Take 1 tablet (25 mg total) by mouth daily. 11/04/20   Renne CriglerGeiple, Joshua, PA-C  ibuprofen (ADVIL) 800 MG tablet Take 1 tablet (800 mg total) by mouth every 8 (eight) hours as needed. 06/24/18   Arabella MerlesShaw, Kimberly D, CNM  prenatal vitamin w/FE, FA (PRENATAL 1 + 1) 27-1 MG TABS tablet Take 1 tablet by mouth daily at 12 noon.    [provider]                                                                                                                                     Past Surgical History Past Surgical History:  Procedure Laterality Date   CESAREAN SECTION     CESAREAN SECTION WITH BILATERAL TUBAL LIGATION N/A 06/22/2018   Procedure: CESAREAN SECTION WITH BILATERAL TUBAL LIGATION;  Surgeon: Catalina Antiguaonstant, Peggy, MD;  Location: [facility] LD ORS;  Service: Obstetrics;  Laterality: N/A;   LAPAROSCOPY FOR ECTOPIC PREGNANCY     Family History Family History  Problem Relation Age of Onset   Diabetes Mother    Hypertension Paternal Grandmother    Heart disease Paternal Grandmother    Stroke Father    Breast cancer Paternal Aunt    Breast cancer Paternal Aunt     Social History Social History   Tobacco Use   Smoking status: Former    Packs/day: 0.25    Types: Cigarettes   Smokeless  tobacco: Never   Tobacco comments:    4 a day  Vaping Use   Vaping Use: Never used  Substance Use Topics   Alcohol use: Not Currently    Comment: socially    Drug use: Not Currently    Types: Marijuana    Comment: 3 joints per week     Allergies Patient has no known allergies.  Review of Systems Review of Systems  Constitutional:  Positive for chills, fatigue and fever.  Respiratory:  Positive for shortness of breath.   Gastrointestinal:  Positive for diarrhea.  Neurological:  Positive for light-headedness.    Physical Exam Vital Signs  I have reviewed the triage vital signs BP 127/85 (BP Location: Left Wrist)   Pulse 97   Temp 100 F (37.8 C) (Oral)   Resp 20   Ht 5\' 5"  (1.651 m)   Wt 127 kg   LMP 01/11/2022   SpO2 91%   BMI 46.59 kg/m   Physical Exam Vitals and nursing note reviewed.  Constitutional:      General: She is not in acute distress.    Appearance: She is well-developed.  HENT:     Head: Normocephalic and atraumatic.  Eyes:     Conjunctiva/sclera: Conjunctivae normal.  Cardiovascular:     Rate and Rhythm: Normal rate and regular rhythm.     Heart sounds: No murmur heard. Pulmonary:     Effort: Pulmonary effort is normal. No  respiratory distress.     Breath sounds: Normal breath sounds.  Abdominal:     Palpations: Abdomen is soft.     Tenderness: There is no abdominal tenderness.  Musculoskeletal:        General: No swelling.     Cervical back: Neck supple.  Skin:    General: Skin is warm and dry.     Capillary Refill: Capillary refill takes less than 2 seconds.  Neurological:     Mental Status: She is alert.  Psychiatric:        Mood and Affect: Mood normal.     ED Results and Treatments Labs (all labs ordered are listed, but only abnormal results are displayed) Labs Reviewed  RESP PANEL BY RT-PCR (RSV, FLU A&B, COVID)  RVPGX2 - Abnormal; Notable for the following components:      Result Value   Influenza A by PCR POSITIVE (*)    All other components within normal limits  COMPREHENSIVE METABOLIC PANEL - Abnormal; Notable for the following components:   Potassium 3.2 (*)    Glucose, Bld 145 (*)    Creatinine, Ser 2.07 (*)    Calcium 8.6 (*)    Total Protein 8.2 (*)    AST 94 (*)    GFR, Estimated 30 (*)    All other components within normal limits  CBC - Abnormal; Notable for the following components:   RBC 5.21 (*)    Hemoglobin 15.8 (*)    HCT 48.2 (*)    All other components within normal limits  LIPASE, BLOOD  URINALYSIS, ROUTINE W REFLEX MICROSCOPIC  I-STAT BETA HCG BLOOD, ED ([facility], [facility], [facility] ONLY)  Radiology DG Chest 2 View  Result Date: 01/13/2022 CLINICAL DATA:  Cough and shortness of breath EXAM: CHEST - 2 VIEW COMPARISON:  01/06/2017 FINDINGS: Airspace opacity within the right lower lobe medially compatible with pneumonia. No pleural effusion or pneumothorax. Stable cardiomediastinal silhouette. IMPRESSION: Right lower lobe pneumonia. Electronically Signed   By: Minerva Fester M.D.   On: 01/13/2022 19:55    Pertinent labs & imaging results that were available  during my care of the patient were reviewed by me and considered in my medical decision making (see MDM for details).  Medications Ordered in ED Medications  sodium chloride 0.9 % bolus 1,000 mL (has no administration in time range)                                                                                                                                     Procedures .Critical Care  Performed by: Glendora Score, MD Authorized by: Glendora Score, MD   Critical care provider statement:    Critical care time (minutes):  30   Critical care was necessary to treat or prevent imminent or life-threatening deterioration of the following conditions:  Dehydration   Critical care was time spent personally by me on the following activities:  Development of treatment plan with patient or surrogate, discussions with consultants, evaluation of patient's response to treatment, examination of patient, ordering and review of laboratory studies, ordering and review of radiographic studies, ordering and performing treatments and interventions, pulse oximetry, re-evaluation of patient's condition and review of old charts   (including critical care time)  Medical Decision Making / ED Course   This patient presents to the ED for concern of diarrhea, cough, fatigue, nausea, this involves an extensive number of treatment options, and is a complaint that carries with it a high risk of complications and morbidity.  The differential diagnosis includes viral gastroenteritis, influenza, bacterial pneumonia, pancreatitis, UTI  MDM: Patient seen emerged part for evaluation of multiple complaints described above.  Physical exam largely unremarkable.  Laboratory evaluation with new creatinine elevation to 2.07 with a normal BUN and a GFR dropped to 30, potassium 3.2 which was repleted in the emergency department.  Pregnancy negative, lipase negative.  Patient is influenza positive which likely explains majority of  her symptoms.  However chest x-ray is also positive for pneumonia and I am unsure if she has an additional bacterial superinfection and thus she was treated with ceftriaxone azithromycin.  Patient was given fluid resuscitation and nausea control and on reevaluation her symptoms have improved significantly.  She is able to tolerate p.o. without difficulty.  I had a long discussion with the patient about her creatinine elevation and the importance of following up outpatient to ensure that this value is improving after fluid resuscitation.  Patient ultimately discharged with symptom control and outpatient follow-up with strict return precautions which she voiced understanding.  Patient vital signs stable at time of discharge.  Patient outside the window for Tamiflu.   Additional history obtained:  -External records from outside source obtained and reviewed including: Chart review including previous notes, labs, imaging, consultation notes   Lab Tests: -I ordered, reviewed, and interpreted labs.   The pertinent results include:   Labs Reviewed  RESP PANEL BY RT-PCR (RSV, FLU A&B, COVID)  RVPGX2 - Abnormal; Notable for the following components:      Result Value   Influenza A by PCR POSITIVE (*)    All other components within normal limits  COMPREHENSIVE METABOLIC PANEL - Abnormal; Notable for the following components:   Potassium 3.2 (*)    Glucose, Bld 145 (*)    Creatinine, Ser 2.07 (*)    Calcium 8.6 (*)    Total Protein 8.2 (*)    AST 94 (*)    GFR, Estimated 30 (*)    All other components within normal limits  CBC - Abnormal; Notable for the following components:   RBC 5.21 (*)    Hemoglobin 15.8 (*)    HCT 48.2 (*)    All other components within normal limits  LIPASE, BLOOD  URINALYSIS, ROUTINE W REFLEX MICROSCOPIC  I-STAT BETA HCG BLOOD, ED ([facility], [facility], [facility] ONLY)        Imaging Studies ordered: I ordered imaging studies including x-ray I independently visualized and interpreted  imaging. I agree with the radiologist interpretation   Medicines ordered and prescription drug management: Meds ordered this encounter  Medications   sodium chloride 0.9 % bolus 1,000 mL    -I have reviewed the patients home medicines and have made adjustments as needed  Critical interventions Fluid resuscitation, multiple antibiotics    Cardiac Monitoring: The patient was maintained on a cardiac monitor.  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   Reevaluation: After the interventions noted above, I reevaluated the patient and found that they have :improved  Co morbidities that complicate the patient evaluation  Past Medical History:  Diagnosis Date   Asthma    exacerbation on 01/31/15   Breast abscess    Bronchitis    Gestational hypertension    History of prior pregnancy with IUGR newborn    Hypertension    Obesity    Preeclampsia       Dispostion: I considered admission for this patient, and using shared decision making, we decided that after fluid resuscitation, patient will be discharged with outpatient PCP follow-up for repeat creatinine testing and return precautions which she voiced understanding.     Final Clinical Impression(s) / ED Diagnoses Final diagnoses:  None     @PCDICTATION @    , MD 01/14/22 [phone]

## 2022-01-17 DIAGNOSIS — Z419 Encounter for procedure for purposes other than remedying health state, unspecified: Secondary | ICD-10-CM | POA: Diagnosis not present

## 2022-02-17 DIAGNOSIS — Z419 Encounter for procedure for purposes other than remedying health state, unspecified: Secondary | ICD-10-CM | POA: Diagnosis not present

## 2022-03-18 DIAGNOSIS — Z419 Encounter for procedure for purposes other than remedying health state, unspecified: Secondary | ICD-10-CM | POA: Diagnosis not present

## 2022-04-18 DIAGNOSIS — Z419 Encounter for procedure for purposes other than remedying health state, unspecified: Secondary | ICD-10-CM | POA: Diagnosis not present

## 2022-05-18 DIAGNOSIS — Z419 Encounter for procedure for purposes other than remedying health state, unspecified: Secondary | ICD-10-CM | POA: Diagnosis not present

## 2022-06-15 ENCOUNTER — Other Ambulatory Visit: Payer: Self-pay

## 2022-06-15 ENCOUNTER — Emergency Department (HOSPITAL_COMMUNITY)
Admission: EM | Admit: 2022-06-15 | Discharge: 2022-06-16 | Disposition: A | Discharge status: Home / Self Care | Payer: Medicaid Other | Attending: Emergency Medicine | Admitting: Emergency Medicine

## 2022-06-15 ENCOUNTER — Encounter (HOSPITAL_COMMUNITY): Payer: Self-pay

## 2022-06-15 DIAGNOSIS — Z7982 Long term (current) use of aspirin: Secondary | ICD-10-CM | POA: Diagnosis not present

## 2022-06-15 DIAGNOSIS — Z79899 Other long term (current) drug therapy: Secondary | ICD-10-CM | POA: Insufficient documentation

## 2022-06-15 DIAGNOSIS — J45909 Unspecified asthma, uncomplicated: Secondary | ICD-10-CM | POA: Diagnosis not present

## 2022-06-15 DIAGNOSIS — I10 Essential (primary) hypertension: Secondary | ICD-10-CM | POA: Insufficient documentation

## 2022-06-15 DIAGNOSIS — N390 Urinary tract infection, site not specified: Secondary | ICD-10-CM | POA: Insufficient documentation

## 2022-06-15 DIAGNOSIS — R109 Unspecified abdominal pain: Secondary | ICD-10-CM | POA: Diagnosis present

## 2022-06-15 LAB — CBC WITH DIFFERENTIAL/PLATELET
Abs Immature Granulocytes: 0.04 10*3/uL (ref 0.00–0.07)
Basophils Absolute: 0.1 10*3/uL (ref 0.0–0.1)
Basophils Relative: 0 %
Eosinophils Absolute: 0 10*3/uL (ref 0.0–0.5)
Eosinophils Relative: 0 %
HCT: 41.7 % (ref 36.0–46.0)
Hemoglobin: 13.6 g/dL (ref 12.0–15.0)
Immature Granulocytes: 0 %
Lymphocytes Relative: 12 %
Lymphs Abs: 1.3 10*3/uL (ref 0.7–4.0)
MCH: 29.8 pg (ref 26.0–34.0)
MCHC: 32.6 g/dL (ref 30.0–36.0)
MCV: 91.2 fL (ref 80.0–100.0)
Monocytes Absolute: 0.7 10*3/uL (ref 0.1–1.0)
Monocytes Relative: 6 %
Neutro Abs: 9.2 10*3/uL — ABNORMAL HIGH (ref 1.7–7.7)
Neutrophils Relative %: 82 %
Platelets: 237 10*3/uL (ref 150–400)
RBC: 4.57 MIL/uL (ref 3.87–5.11)
RDW: 13.2 % (ref 11.5–15.5)
WBC: 11.3 10*3/uL — ABNORMAL HIGH (ref 4.0–10.5)
nRBC: 0 % (ref 0.0–0.2)

## 2022-06-15 LAB — COMPREHENSIVE METABOLIC PANEL
ALT: 20 U/L (ref 0–44)
AST: 23 U/L (ref 15–41)
Albumin: 3.3 g/dL — ABNORMAL LOW (ref 3.5–5.0)
Alkaline Phosphatase: 65 U/L (ref 38–126)
Anion gap: 12 (ref 5–15)
BUN: 6 mg/dL (ref 6–20)
CO2: 22 mmol/L (ref 22–32)
Calcium: 9 mg/dL (ref 8.9–10.3)
Chloride: 99 mmol/L (ref 98–111)
Creatinine, Ser: 1.14 mg/dL — ABNORMAL HIGH (ref 0.44–1.00)
GFR, Estimated: >60 mL/min (ref >60)
Glucose, Bld: 215 mg/dL — ABNORMAL HIGH (ref 70–99)
Potassium: 3.2 mmol/L — ABNORMAL LOW (ref 3.5–5.1)
Sodium: 133 mmol/L — ABNORMAL LOW (ref 135–145)
Total Bilirubin: 0.6 mg/dL (ref 0.3–1.2)
Total Protein: 8.1 g/dL (ref 6.5–8.1)

## 2022-06-15 LAB — LIPASE, BLOOD: Lipase: 23 U/L (ref 11–51)

## 2022-06-15 LAB — URINALYSIS, ROUTINE W REFLEX MICROSCOPIC
Bilirubin Urine: NEGATIVE
Glucose, UA: NEGATIVE mg/dL
Hgb urine dipstick: NEGATIVE
Ketones, ur: 5 mg/dL — AB
Nitrite: NEGATIVE
Protein, ur: 100 mg/dL — AB
Specific Gravity, Urine: 1.018 (ref 1.005–1.030)
WBC, UA: >50 WBC/hpf (ref 0–5)
pH: 5 (ref 5.0–8.0)

## 2022-06-15 LAB — I-STAT BETA HCG BLOOD, ED (MC, WL, AP ONLY): I-stat hCG, quantitative: <5 m[IU]/mL (ref <5)

## 2022-06-15 MED ORDER — MORPHINE SULFATE (PF) 4 MG/ML IV SOLN
4.0000 mg | Freq: Once | INTRAVENOUS | Status: AC
  Administered 2022-06-15: 4 mg via INTRAVENOUS
  Filled 2022-06-15: qty 1

## 2022-06-15 MED ORDER — SODIUM CHLORIDE 0.9 % IV SOLN
1.0000 g | Freq: Once | INTRAVENOUS | Status: AC
  Administered 2022-06-15: 1 g via INTRAVENOUS
  Filled 2022-06-15: qty 10

## 2022-06-15 MED ORDER — HYDROCHLOROTHIAZIDE 12.5 MG PO TABS
25.0000 mg | ORAL_TABLET | Freq: Once | ORAL | Status: AC
  Administered 2022-06-15: 25 mg via ORAL
  Filled 2022-06-15: qty 2

## 2022-06-15 MED ORDER — MORPHINE SULFATE (PF) 4 MG/ML IV SOLN
4.0000 mg | Freq: Once | INTRAVENOUS | Status: AC
  Administered 2022-06-16: 4 mg via INTRAVENOUS
  Filled 2022-06-15: qty 1

## 2022-06-15 MED ORDER — SODIUM CHLORIDE 0.9 % IV BOLUS
1000.0000 mL | Freq: Once | INTRAVENOUS | Status: AC
  Administered 2022-06-15: 1000 mL via INTRAVENOUS

## 2022-06-15 NOTE — ED Provider Notes (Signed)
[facility] EMERGENCY DEPARTMENT AT [facility] Provider Note   CSN: [id number] Arrival date & time: 06/15/22  2009     History {Add pertinent medical, surgical, social history, OB history to HPI:1} Chief Complaint  Patient presents with   Abdominal Pain   Dysuria   Headache    Veronica Adams is a 42 y.o. female.  The history is provided by the patient. No language interpreter was used.  Abdominal Pain Associated symptoms: dysuria   Dysuria Associated symptoms: abdominal pain   Headache Associated symptoms: abdominal pain      42 year old female sitting history of obesity, hypertension, asthma, presenting today with complaint of abdominal pain.  Patient report for the past week she has had subjective fever and chills followed by urinary discomfort which includes burning urination increased frequency and urgency and now she is having pain to the right side of her abdomen.  Pain is crampy and achy moderate intensity.  No complaints of chest pain or shortness of breath no productive cough no nausea vomiting or diarrhea no postprandial pain no vaginal bleeding or vaginal discharge.  She tries taking Tylenol at home for her symptoms without adequate relief.  Furthermore patient also requests for refill of her blood pressure medication.  States that she has been out of it for "quite a while" and she also request to get set up with a PCP as she is no longer having a primary care provider.  Initial triage note states patient requesting for STD check, at this time patient does not have any specific request for me.  Home Medications Prior to Admission medications   Medication Sig Start Date End Date Taking? Authorizing Provider  amoxicillin-clavulanate (AUGMENTIN) 875-125 MG tablet Take 1 tablet by mouth every 12 (twelve) hours. 01/14/22   Kommor, Wyn Forster, MD  aspirin EC 81 MG tablet Take 81 mg by mouth daily.    [provider]  hydrochlorothiazide (HYDRODIURIL) 25 MG  tablet Take 1 tablet (25 mg total) by mouth daily. 11/04/20   Renne Crigler, PA-C  ibuprofen (ADVIL) 800 MG tablet Take 1 tablet (800 mg total) by mouth every 8 (eight) hours as needed. 06/24/18   Arabella Merles, CNM  loperamide (IMODIUM) 2 MG capsule Take 1 capsule (2 mg total) by mouth 4 (four) times daily as needed for diarrhea or loose stools. 01/14/22   Kommor, Madison, MD  ondansetron (ZOFRAN-ODT) 4 MG disintegrating tablet Take 1 tablet (4 mg total) by mouth every 8 (eight) hours as needed for nausea or vomiting. 01/14/22   Kommor, Madison, MD  prenatal vitamin w/FE, FA (PRENATAL 1 + 1) 27-1 MG TABS tablet Take 1 tablet by mouth daily at 12 noon.    [provider]      Allergies    Patient has no known allergies.    Review of Systems   Review of Systems  Gastrointestinal:  Positive for abdominal pain.  Genitourinary:  Positive for dysuria.  Neurological:  Positive for headaches.  All other systems reviewed and are negative.   Physical Exam Updated Vital Signs BP (!) 179/135 (BP Location: Right Arm)   Pulse (!) 108   Temp 99.5 F (37.5 C) (Oral)   Resp 18   Ht 5\' 5"  (1.651 m)   Wt (!) 137.4 kg   LMP 05/31/2022 (Exact Date)   SpO2 100%   BMI 50.42 kg/m  Physical Exam Vitals and nursing note reviewed.  Constitutional:      General: She is not in acute distress.  Appearance: She is well-developed. She is obese.  HENT:     Head: Atraumatic.  Eyes:     Conjunctiva/sclera: Conjunctivae normal.  Cardiovascular:     Rate and Rhythm: Normal rate and regular rhythm.  Pulmonary:     Effort: Pulmonary effort is normal.  Abdominal:     Palpations: Abdomen is soft.     Tenderness: There is no abdominal tenderness (No significant reproducible abdominal tenderness.  Negative Murphy sign no pain at McBurney's point). There is no right CVA tenderness, left CVA tenderness, guarding or rebound. Negative signs include Murphy's sign, Rovsing's sign and McBurney's sign.   Musculoskeletal:     Cervical back: Neck supple.  Skin:    Findings: No rash.  Neurological:     Mental Status: She is alert.  Psychiatric:        Mood and Affect: Mood normal.     ED Results / Procedures / Treatments   Labs (all labs ordered are listed, but only abnormal results are displayed) Labs Reviewed  COMPREHENSIVE METABOLIC PANEL - Abnormal; Notable for the following components:      Result Value   Sodium 133 (*)    Potassium 3.2 (*)    Glucose, Bld 215 (*)    Creatinine, Ser 1.14 (*)    Albumin 3.3 (*)    All other components within normal limits  CBC WITH DIFFERENTIAL/PLATELET - Abnormal; Notable for the following components:   WBC 11.3 (*)    Neutro Abs 9.2 (*)    All other components within normal limits  URINALYSIS, ROUTINE W REFLEX MICROSCOPIC - Abnormal; Notable for the following components:   APPearance HAZY (*)    Ketones, ur 5 (*)    Protein, ur 100 (*)    Leukocytes,Ua LARGE (*)    Bacteria, UA FEW (*)    Non Squamous Epithelial 0-5 (*)    All other components within normal limits  LIPASE, BLOOD  I-STAT BETA HCG BLOOD, ED ([facility], [facility], [facility] ONLY)    EKG None  Radiology No results found.  Procedures Procedures  {Document cardiac monitor, telemetry assessment procedure when appropriate:1}  Medications Ordered in ED Medications - No data to display  ED Course/ Medical Decision Making/ A&P   {   Click here for ABCD2, HEART and other calculatorsREFRESH Note before signing :1}                          Medical Decision Making Amount and/or Complexity of Data Reviewed Labs: ordered.  Risk Prescription drug management.   BP (!) 179/135 (BP Location: Right Arm)   Pulse (!) 108   Temp 99.5 F (37.5 C) (Oral)   Resp 18   Ht 5\' 5"  (1.651 m)   Wt (!) 137.4 kg   LMP 05/31/2022 (Exact Date)   SpO2 100%   BMI 50.42 kg/m   62:5 PM 42 year old female sitting history of obesity, hypertension, asthma, presenting today with complaint of  abdominal pain.  Patient report for the past week she has had subjective fever and chills followed by urinary discomfort which includes burning urination increased frequency and urgency and now she is having pain to the right side of her abdomen.  Pain is crampy and achy moderate intensity.  No complaints of chest pain or shortness of breath no productive cough no nausea vomiting or diarrhea no postprandial pain no vaginal bleeding or vaginal discharge.  She tries taking Tylenol at home for her symptoms without adequate relief.  Furthermore patient also requests for refill of her blood pressure medication.  States that she has been out of it for "quite a while" and she also request to get set up with a PCP as she is no longer having a primary care provider.  Initial triage note states patient requesting for STD check, at this time patient does not have any specific request for me.  On exam this is a morbidly obese female resting comfortably in bed appears to be in no acute discomfort.  Heart with normal rate and rhythm, lungs are clear to auscultation bilaterally abdomen is soft without any focal point tenderness no CVA tenderness.  Vitals are notable for elevated blood pressure of 179/135.  She is tachycardic with heart rate of 108.  She is afebrile no hypoxia.  -Labs ordered, independently viewed and interpreted by me.  Labs remarkable for *** -The patient was maintained on a cardiac monitor.  I personally viewed and interpreted the cardiac monitored which showed an underlying rhythm of: *** -Imaging independently viewed and interpreted by me and I agree with radiologist's interpretation.  Result remarkable for *** -This patient presents to the ED for concern of ***, this involves an extensive number of treatment options, and is a complaint that carries with it a high risk of complications and morbidity.  The differential diagnosis includes *** -Co morbidities that complicate the patient evaluation  includes *** -Treatment includes *** -Reevaluation of the patient after these medicines showed that the patient {resolved/improved/worsened:23923::"improved"} -PCP office notes or outside notes reviewed -Discussion with specialist *** -Escalation to admission/observation considered: patients feels much better, is comfortable with discharge, and will follow up with PCP -Prescription medication considered, patient comfortable with *** -Social Determinant of Health considered which includes ***   {Document critical care time when appropriate:1} {Document review of labs and clinical decision tools ie heart score, Chads2Vasc2 etc:1}  {Document your independent review of radiology images, and any outside records:1} {Document your discussion with family members, caretakers, and with consultants:1} {Document social determinants of health affecting pt's care:1} {Document your decision making why or why not admission, treatments were needed:1} Final Clinical Impression(s) / ED Diagnoses Final diagnoses:  None    Rx / DC Orders ED Discharge Orders     None

## 2022-06-15 NOTE — ED Triage Notes (Signed)
Pt arrives c/o dysuria, frequent urination, chills/sweats, and RUQ pain x3 days. Requesting to be tested for STDs while here today. Denies n/v. States that she has also had some diarrhea. Took tylenol yesterday without relief.  Noted to be hypertensive in triage - states hx of HTN and has not been taking her BP meds x1 month. C/o headache.

## 2022-06-16 MED ORDER — CEPHALEXIN 500 MG PO CAPS: 500.0000 mg | ORAL_CAPSULE | Freq: Four times a day (QID) | ORAL | 0 refills | Status: AC

## 2022-06-16 MED ORDER — HYDROCHLOROTHIAZIDE 25 MG PO TABS: 25.0000 mg | ORAL_TABLET | Freq: Every day | ORAL | 1 refills | Status: DC

## 2022-06-18 DIAGNOSIS — Z419 Encounter for procedure for purposes other than remedying health state, unspecified: Secondary | ICD-10-CM | POA: Diagnosis not present

## 2022-07-18 ENCOUNTER — Encounter: Payer: Self-pay | Admitting: Family Medicine

## 2022-07-18 DIAGNOSIS — Z419 Encounter for procedure for purposes other than remedying health state, unspecified: Secondary | ICD-10-CM | POA: Diagnosis not present

## 2022-07-18 DIAGNOSIS — Z0000 Encounter for general adult medical examination without abnormal findings: Secondary | ICD-10-CM

## 2022-08-18 DIAGNOSIS — Z419 Encounter for procedure for purposes other than remedying health state, unspecified: Secondary | ICD-10-CM | POA: Diagnosis not present

## 2022-09-29 ENCOUNTER — Emergency Department (HOSPITAL_COMMUNITY): Payer: Self-pay

## 2022-09-29 ENCOUNTER — Emergency Department (HOSPITAL_COMMUNITY)
Admission: EM | Admit: 2022-09-29 | Discharge: 2022-09-29 | Disposition: A | Discharge status: Home / Self Care | Payer: Self-pay

## 2022-09-29 ENCOUNTER — Encounter (HOSPITAL_COMMUNITY): Payer: Self-pay

## 2022-09-29 ENCOUNTER — Other Ambulatory Visit: Payer: Self-pay

## 2022-09-29 DIAGNOSIS — R197 Diarrhea, unspecified: Secondary | ICD-10-CM | POA: Insufficient documentation

## 2022-09-29 DIAGNOSIS — R1084 Generalized abdominal pain: Secondary | ICD-10-CM | POA: Insufficient documentation

## 2022-09-29 DIAGNOSIS — E278 Other specified disorders of adrenal gland: Secondary | ICD-10-CM

## 2022-09-29 DIAGNOSIS — I10 Essential (primary) hypertension: Secondary | ICD-10-CM | POA: Insufficient documentation

## 2022-09-29 DIAGNOSIS — Z79899 Other long term (current) drug therapy: Secondary | ICD-10-CM | POA: Insufficient documentation

## 2022-09-29 LAB — URINALYSIS, ROUTINE W REFLEX MICROSCOPIC
Bilirubin Urine: NEGATIVE
Glucose, UA: NEGATIVE mg/dL
Hgb urine dipstick: NEGATIVE
Ketones, ur: NEGATIVE mg/dL
Leukocytes,Ua: NEGATIVE
Nitrite: NEGATIVE
Protein, ur: NEGATIVE mg/dL
Specific Gravity, Urine: 1.023 (ref 1.005–1.030)
pH: 5 (ref 5.0–8.0)

## 2022-09-29 LAB — COMPREHENSIVE METABOLIC PANEL
ALT: 17 U/L (ref 0–44)
AST: 18 U/L (ref 15–41)
Albumin: 3.4 g/dL — ABNORMAL LOW (ref 3.5–5.0)
Alkaline Phosphatase: 51 U/L (ref 38–126)
Anion gap: 8 (ref 5–15)
BUN: 9 mg/dL (ref 6–20)
CO2: 25 mmol/L (ref 22–32)
Calcium: 8.9 mg/dL (ref 8.9–10.3)
Chloride: 103 mmol/L (ref 98–111)
Creatinine, Ser: 0.95 mg/dL (ref 0.44–1.00)
GFR, Estimated: >60 mL/min (ref >60)
Glucose, Bld: 102 mg/dL — ABNORMAL HIGH (ref 70–99)
Potassium: 3.9 mmol/L (ref 3.5–5.1)
Sodium: 136 mmol/L (ref 135–145)
Total Bilirubin: 0.6 mg/dL (ref 0.3–1.2)
Total Protein: 7.1 g/dL (ref 6.5–8.1)

## 2022-09-29 LAB — CBC
HCT: 42.6 % (ref 36.0–46.0)
Hemoglobin: 13.4 g/dL (ref 12.0–15.0)
MCH: 29.1 pg (ref 26.0–34.0)
MCHC: 31.5 g/dL (ref 30.0–36.0)
MCV: 92.4 fL (ref 80.0–100.0)
Platelets: 261 10*3/uL (ref 150–400)
RBC: 4.61 MIL/uL (ref 3.87–5.11)
RDW: 14.1 % (ref 11.5–15.5)
WBC: 4.8 10*3/uL (ref 4.0–10.5)
nRBC: 0 % (ref 0.0–0.2)

## 2022-09-29 LAB — LIPASE, BLOOD: Lipase: 22 U/L (ref 11–51)

## 2022-09-29 LAB — HCG, SERUM, QUALITATIVE: Preg, Serum: NEGATIVE

## 2022-09-29 MED ORDER — ONDANSETRON HCL 4 MG/2ML IJ SOLN
4.0000 mg | Freq: Once | INTRAMUSCULAR | Status: AC
  Administered 2022-09-29: 4 mg via INTRAVENOUS
  Filled 2022-09-29: qty 2

## 2022-09-29 MED ORDER — DICYCLOMINE HCL 10 MG PO CAPS: 10.0000 mg | ORAL_CAPSULE | Freq: Three times a day (TID) | ORAL | 0 refills | Status: AC

## 2022-09-29 MED ORDER — HYDROCHLOROTHIAZIDE 25 MG PO TABS: 25.0000 mg | ORAL_TABLET | Freq: Every day | ORAL | 1 refills | Status: AC

## 2022-09-29 MED ORDER — KETOROLAC TROMETHAMINE 30 MG/ML IJ SOLN
15.0000 mg | Freq: Once | INTRAMUSCULAR | Status: AC
  Administered 2022-09-29: 15 mg via INTRAVENOUS
  Filled 2022-09-29: qty 1

## 2022-09-29 MED ORDER — SODIUM CHLORIDE 0.9 % IV BOLUS
1000.0000 mL | Freq: Once | INTRAVENOUS | Status: AC
  Administered 2022-09-29: 1000 mL via INTRAVENOUS

## 2022-09-29 MED ORDER — MORPHINE SULFATE (PF) 4 MG/ML IV SOLN
4.0000 mg | Freq: Once | INTRAVENOUS | Status: AC
  Administered 2022-09-29: 4 mg via INTRAVENOUS
  Filled 2022-09-29: qty 1

## 2022-09-29 MED ORDER — IOHEXOL 350 MG/ML SOLN
75.0000 mL | Freq: Once | INTRAVENOUS | Status: AC | PRN
  Administered 2022-09-29: 75 mL via INTRAVENOUS

## 2022-09-29 NOTE — Discharge Instructions (Addendum)
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  Follow up with primary care for outpatient evaluation and further imaging.  You will need to have an mri for further evaluation Take your blood pressure medications as prescribed.  Return if any problems.

## 2022-09-29 NOTE — ED Triage Notes (Signed)
Pt. Stated, Veronica Adams had N/V/D and stomach pain for 5-6 weeks, it happens every time I eat.

## 2022-09-29 NOTE — ED Provider Notes (Signed)
Patient's care assumed by me at 4 PM.  Patient is awaiting CT scan of her abdomen.  Patient has had several weeks of diarrhea.  Patient reports she has abdominal cramping after she eats. 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  Radiologist reports need for follow-up outpatient MRI. Discussed the results of the CT with the patient.  She is aware that she will need outpatient follow-up.  Patient has been prescribed hydrochlorothiazide for her blood pressure I will try her on Bentyl for the abdominal cramping.  Patient is also advised she can try over-the-counter Pepcid to see if this helps with her symptoms.  Patient is discharged in stable condition she is advised to return to the emergency department if any problems or concerns.   Elson Areas, [place] 09/29/22 1731    Terald Sleeper, MD 09/30/22 2007

## 2022-09-29 NOTE — Care Management (Signed)
Patient with PCP needs. PCP placed on AVS

## 2022-09-29 NOTE — ED Notes (Signed)
Pt states she has been out of her BP meds at home x2 days.

## 2022-09-29 NOTE — ED Provider Notes (Signed)
[place] EMERGENCY DEPARTMENT AT [facility] Provider Note   CSN: [id number] Arrival date & time: 09/29/22  1040     History  Chief Complaint  Patient presents with   Abdominal Pain   Emesis   Nausea   Diarrhea    Veronica Adams is a 42 y.o. female.  Veronica Adams is a 42 y.o. female with a history of hypertension, obesity and bronchitis, who presents to the emergency department for evaluation of abdominal pain and diarrhea.  Patient reports symptoms have been ongoing for at least 6 to 7 weeks.  She reports that she has 4-5 episodes of watery foul-smelling diarrhea and has had some occasional nausea and vomiting.  She has not noticed any blood in her stool or dark black stool.  With this she reports she has had some generalized abdominal pain.  She works as a Lawyer at a nursing home and has had multiple patients with C. difficile and is worried that she could have this from exposure, she has not been on any antibiotics recently.  She reports symptoms are worse after eating and she feels like anything she eats or drinks goes right through her.  No history of abdominal surgeries.  No other aggravating or alleviating factors.  The history is provided by the patient.  Abdominal Pain Associated symptoms: diarrhea, nausea and vomiting   Associated symptoms: no chest pain, no chills, no cough, no dysuria, no fever, no shortness of breath, no vaginal bleeding and no vaginal discharge   Emesis Associated symptoms: abdominal pain and diarrhea   Associated symptoms: no chills, no cough and no fever   Diarrhea Associated symptoms: abdominal pain and vomiting   Associated symptoms: no chills and no fever        Home Medications Prior to Admission medications   Medication Sig Start Date End Date Taking? Authorizing Provider  acetaminophen (TYLENOL) 325 MG tablet Take 650 mg by mouth every 6 (six) hours as needed for mild pain.    [provider]  cephALEXin  (KEFLEX) 500 MG capsule Take 1 capsule (500 mg total) by mouth 4 (four) times daily. 06/16/22   Fayrene Helper, PA-C  hydrochlorothiazide (HYDRODIURIL) 25 MG tablet Take 1 tablet (25 mg total) by mouth daily. 09/29/22   Dartha Lodge, PA-C      Allergies    Patient has no known allergies.    Review of Systems   Review of Systems  Constitutional:  Negative for chills and fever.  Respiratory:  Negative for cough and shortness of breath.   Cardiovascular:  Negative for chest pain.  Gastrointestinal:  Positive for abdominal pain, diarrhea, nausea and vomiting. Negative for blood in stool.  Genitourinary:  Negative for dysuria, frequency, vaginal bleeding and vaginal discharge.  All other systems reviewed and are negative.   Physical Exam Updated Vital Signs BP (!) 189/125   Pulse 60   Temp 98 F (36.7 C) (Oral)   Resp 19   Ht 5\' 5"  (1.651 m)   Wt 131.5 kg   LMP 09/08/2022   SpO2 100%   BMI 48.26 kg/m  Physical Exam Vitals and nursing note reviewed.  Constitutional:      General: She is not in acute distress.    Appearance: Normal appearance. She is well-developed. She is obese. She is not ill-appearing or diaphoretic.  HENT:     Head: Normocephalic and atraumatic.  Eyes:     General:        Right eye: No  discharge.        Left eye: No discharge.     Pupils: Pupils are equal, round, and reactive to light.  Cardiovascular:     Rate and Rhythm: Normal rate and regular rhythm.     Pulses: Normal pulses.     Heart sounds: Normal heart sounds.  Pulmonary:     Effort: Pulmonary effort is normal. No respiratory distress.     Breath sounds: Normal breath sounds. No wheezing or rales.     Comments: Respirations equal and unlabored, patient able to speak in full sentences, lungs clear to auscultation bilaterally  Abdominal:     General: Bowel sounds are normal. There is no distension.     Palpations: Abdomen is soft. There is no mass.     Tenderness: There is abdominal tenderness.  There is no guarding.     Comments: Abdomen soft, nondistended, bowel sounds present throughout, there is mild generalized tenderness noted, slightly more severe along the left side of the abdomen without guarding or rebound  Musculoskeletal:        General: No deformity.     Cervical back: Neck supple.  Skin:    General: Skin is warm and dry.     Capillary Refill: Capillary refill takes less than 2 seconds.  Neurological:     Mental Status: She is alert and oriented to person, place, and time.     Coordination: Coordination normal.     Comments: Speech is clear, able to follow commands CN III-XII intact Normal strength in upper and lower extremities bilaterally including dorsiflexion and plantar flexion, strong and equal grip strength Sensation normal to light and sharp touch Moves extremities without ataxia, coordination intact  Psychiatric:        Mood and Affect: Mood normal.        Behavior: Behavior normal.     ED Results / Procedures / Treatments   Labs (all labs ordered are listed, but only abnormal results are displayed) Labs Reviewed  COMPREHENSIVE METABOLIC PANEL - Abnormal; Notable for the following components:      Result Value   Glucose, Bld 102 (*)    Albumin 3.4 (*)    All other components within normal limits  URINALYSIS, ROUTINE W REFLEX MICROSCOPIC - Abnormal; Notable for the following components:   APPearance HAZY (*)    All other components within normal limits  C DIFFICILE QUICK SCREEN W PCR REFLEX    LIPASE, BLOOD  CBC  HCG, SERUM, QUALITATIVE    EKG None  Radiology CT ABDOMEN PELVIS W CONTRAST  Result Date: 09/29/2022 CLINICAL DATA:  Abdominal pain Vomiting Nausea Diarrhea EXAM: CT ABDOMEN AND PELVIS WITH CONTRAST TECHNIQUE: Multidetector CT imaging of the abdomen and pelvis was performed using the standard protocol following bolus administration of intravenous contrast. RADIATION DOSE REDUCTION: This exam was performed according to the  departmental dose-optimization program which includes automated exposure control, adjustment of the mA and/or kV according to patient size and/or use of iterative reconstruction technique. CONTRAST:  75 mL OMNIPAQUE IOHEXOL 350 MG/ML SOLN COMPARISON:  None available FINDINGS: Lower chest: No acute abnormality. Hepatobiliary: No focal liver abnormality is seen. No gallstones, gallbladder wall thickening, or biliary dilatation. Pancreas: Unremarkable. No pancreatic ductal dilatation or surrounding inflammatory changes. Spleen: Normal in size without focal abnormality. Adrenals/Urinary Tract: 3.4 x 2.6 cm right adrenal mass can not be characterized as a adenoma given its internal density. Adrenal glands, kidneys, ureters, and bladder otherwise normal. Stomach/Bowel: Stomach is within normal limits. Appendix appears  normal. No evidence of bowel wall thickening, distention, or inflammatory changes. Vascular/Lymphatic: No significant vascular findings are present. No enlarged abdominal or pelvic lymph nodes. Reproductive: Uterus and bilateral adnexa are unremarkable. Other: No abdominal wall hernia or abnormality. No abdominopelvic ascites. Musculoskeletal: No acute or significant osseous findings. IMPRESSION: 1. No cause for the patient's symptoms identified. 2. 3.4 x 2.6 cm right adrenal mass can not be characterized as a adenoma given its internal density. Further evaluation with nonemergent adrenal mass protocol MRI or CT should be obtained. Electronically Signed   By: Acquanetta Belling M.D.   On: 09/29/2022 16:03    Procedures Procedures    Medications Ordered in ED Medications  ketorolac (TORADOL) 30 MG/ML injection 15 mg (has no administration in time range)  sodium chloride 0.9 % bolus 1,000 mL (1,000 mLs Intravenous New Bag/Given 09/29/22 1331)  morphine (PF) 4 MG/ML injection 4 mg (4 mg Intravenous Given 09/29/22 1330)  ondansetron (ZOFRAN) injection 4 mg (4 mg Intravenous Given 09/29/22 1330)  iohexol  (OMNIPAQUE) 350 MG/ML injection 75 mL (75 mLs Intravenous Contrast Given 09/29/22 1447)    ED Course/ Medical Decision Making/ A&P                                 Medical Decision Making Amount and/or Complexity of Data Reviewed Labs: ordered. Radiology: ordered.  Risk Prescription drug management.   Patient presents to the ED with complaints of abdominal pain and diarrhea. Patient nontoxic appearing, in no apparent distress, vitals WNL. On exam patient tender to palpation along the left side of the abdomen, no peritoneal signs. Will evaluate with labs and CT. Patient reports exposure to C. difficile at work, will order testing if patient is able to provide stool sample  Ddx including but not limited to: Colitis, C. difficile, gastroenteritis, IBS, IBD, cholecystitis, cholelithiasis, pancreatitis.  Additional history obtained:  Additional history obtained from chart review & nursing note review.   Lab Tests:  I Ordered, viewed, and interpreted labs, which included:  CBC: No leukocytosis and normal hemoglobin CMP: No significant electrolyte derangements, normal renal and liver function Lipase: WNL UA: No hematuria or signs of infection Preg test: Negative C. difficile quick screen with PCR reflex pending patient providing a stool sample  Imaging Studies ordered:  I ordered imaging studies which included CT abdomen pelvis which is pending at shift change  ED Course:  I ordered IV fluid bolus, morphine and Zofran and patient reports some improvement in her pain  Patient also expressed that her blood pressure has been running higher than normal and she ran out of her HCTZ 2 to 3 days ago.  Did get some improvement in blood pressure with pain control but will also represcribe HCTZ, discussed with patient that I am hesitant to add or modify blood pressure medication regimen in the setting of acute pain.  Patient does not currently have a primary care provider.  TOC consult placed to  assist with PCP follow-up  At shift change care signed out to PA Langston Masker who will follow-up on pending CT results and disposition appropriately.  Portions of this note were generated with Scientist, clinical (histocompatibility and immunogenetics). Dictation errors may occur despite best attempts at proofreading.           Final Clinical Impression(s) / ED Diagnoses Final diagnoses:  Diarrhea, unspecified type  Generalized abdominal pain    Rx / DC Orders ED Discharge Orders  Ordered    hydrochlorothiazide (HYDRODIURIL) 25 MG tablet  Daily        09/29/22 1609              Dartha Lodge, [place] 09/29/22 1618    Durwin Glaze, MD 09/30/22 1122

## 2023-07-29 DIAGNOSIS — Z419 Encounter for procedure for purposes other than remedying health state, unspecified: Secondary | ICD-10-CM | POA: Diagnosis not present

## 2023-08-15 ENCOUNTER — Encounter: Payer: Self-pay | Admitting: Advanced Practice Midwife

## 2023-08-29 DIAGNOSIS — Z419 Encounter for procedure for purposes other than remedying health state, unspecified: Secondary | ICD-10-CM | POA: Diagnosis not present

## 2024-01-04 ENCOUNTER — Ambulatory Visit: Payer: Self-pay
